# Patient Record
Sex: Male | Born: 1980 | Race: White | Hispanic: No | Marital: Married | Smoking: Current every day smoker
Health system: Southern US, Community
[De-identification: ages and names within clinical notes are randomized; demographics above are authoritative.]

## PROBLEM LIST (undated history)

## (undated) DIAGNOSIS — F1110 Opioid abuse, uncomplicated: Secondary | ICD-10-CM

## (undated) HISTORY — PX: TYMPANOSTOMY TUBE PLACEMENT: SHX32

---

## 2004-08-06 ENCOUNTER — Ambulatory Visit (HOSPITAL_COMMUNITY): Admission: RE | Admit: 2004-08-06 | Discharge: 2004-08-06 | Payer: Self-pay | Admitting: Family Medicine

## 2004-08-26 ENCOUNTER — Ambulatory Visit (HOSPITAL_COMMUNITY): Admission: RE | Admit: 2004-08-26 | Discharge: 2004-08-26 | Payer: Self-pay | Admitting: Orthopaedic Surgery

## 2014-11-28 ENCOUNTER — Emergency Department (HOSPITAL_COMMUNITY)
Admission: EM | Admit: 2014-11-28 | Discharge: 2014-11-28 | Disposition: A | Discharge status: Home / Self Care | Payer: BLUE CROSS/BLUE SHIELD | Attending: Emergency Medicine | Admitting: Emergency Medicine

## 2014-11-28 ENCOUNTER — Emergency Department (HOSPITAL_COMMUNITY): Payer: BLUE CROSS/BLUE SHIELD

## 2014-11-28 ENCOUNTER — Encounter (HOSPITAL_COMMUNITY): Payer: Self-pay | Admitting: Emergency Medicine

## 2014-11-28 DIAGNOSIS — M546 Pain in thoracic spine: Secondary | ICD-10-CM | POA: Diagnosis not present

## 2014-11-28 DIAGNOSIS — M545 Low back pain, unspecified: Secondary | ICD-10-CM

## 2014-11-28 DIAGNOSIS — R51 Headache: Secondary | ICD-10-CM | POA: Diagnosis not present

## 2014-11-28 DIAGNOSIS — Z79899 Other long term (current) drug therapy: Secondary | ICD-10-CM | POA: Insufficient documentation

## 2014-11-28 DIAGNOSIS — M549 Dorsalgia, unspecified: Secondary | ICD-10-CM

## 2014-11-28 DIAGNOSIS — R112 Nausea with vomiting, unspecified: Secondary | ICD-10-CM | POA: Diagnosis not present

## 2014-11-28 DIAGNOSIS — R1013 Epigastric pain: Secondary | ICD-10-CM | POA: Diagnosis not present

## 2014-11-28 DIAGNOSIS — Z720 Tobacco use: Secondary | ICD-10-CM | POA: Insufficient documentation

## 2014-11-28 DIAGNOSIS — R109 Unspecified abdominal pain: Secondary | ICD-10-CM | POA: Diagnosis not present

## 2014-11-28 DIAGNOSIS — R0602 Shortness of breath: Secondary | ICD-10-CM | POA: Insufficient documentation

## 2014-11-28 DIAGNOSIS — R1010 Upper abdominal pain, unspecified: Secondary | ICD-10-CM | POA: Diagnosis not present

## 2014-11-28 HISTORY — DX: Opioid abuse, uncomplicated: F11.10

## 2014-11-28 LAB — COMPREHENSIVE METABOLIC PANEL
ALK PHOS: 75 U/L (ref 38–126)
ALT: 35 U/L (ref 17–63)
ANION GAP: 10 (ref 5–15)
AST: 28 U/L (ref 15–41)
Albumin: 4.7 g/dL (ref 3.5–5.0)
BUN: 20 mg/dL (ref 6–20)
CALCIUM: 9.4 mg/dL (ref 8.9–10.3)
CO2: 26 mmol/L (ref 22–32)
Chloride: 104 mmol/L (ref 101–111)
Creatinine, Ser: 1.01 mg/dL (ref 0.61–1.24)
GLUCOSE: 101 mg/dL — AB (ref 70–99)
Potassium: 4.2 mmol/L (ref 3.5–5.1)
Sodium: 140 mmol/L (ref 135–145)
TOTAL PROTEIN: 7.5 g/dL (ref 6.5–8.1)
Total Bilirubin: 0.5 mg/dL (ref 0.3–1.2)

## 2014-11-28 LAB — CBC WITH DIFFERENTIAL/PLATELET
BASOS ABS: 0 10*3/uL (ref 0.0–0.1)
BASOS PCT: 0 % (ref 0–1)
EOS PCT: 2 % (ref 0–5)
Eosinophils Absolute: 0.4 10*3/uL (ref 0.0–0.7)
HCT: 42.6 % (ref 39.0–52.0)
HEMOGLOBIN: 14.2 g/dL (ref 13.0–17.0)
Lymphocytes Relative: 24 % (ref 12–46)
Lymphs Abs: 4.3 10*3/uL — ABNORMAL HIGH (ref 0.7–4.0)
MCH: 28.3 pg (ref 26.0–34.0)
MCHC: 33.3 g/dL (ref 30.0–36.0)
MCV: 85 fL (ref 78.0–100.0)
MONO ABS: 1.2 10*3/uL — AB (ref 0.1–1.0)
Monocytes Relative: 7 % (ref 3–12)
Neutro Abs: 11.6 10*3/uL — ABNORMAL HIGH (ref 1.7–7.7)
Neutrophils Relative %: 67 % (ref 43–77)
Platelets: 230 10*3/uL (ref 150–400)
RBC: 5.01 MIL/uL (ref 4.22–5.81)
RDW: 13.6 % (ref 11.5–15.5)
WBC: 17.5 10*3/uL — AB (ref 4.0–10.5)

## 2014-11-28 LAB — LIPASE, BLOOD: Lipase: 33 U/L (ref 22–51)

## 2014-11-28 MED ORDER — HYDROMORPHONE HCL 1 MG/ML IJ SOLN
1.0000 mg | Freq: Once | INTRAMUSCULAR | Status: AC
  Administered 2014-11-28: 1 mg via INTRAVENOUS
  Filled 2014-11-28: qty 1

## 2014-11-28 MED ORDER — IOHEXOL 300 MG/ML  SOLN
50.0000 mL | Freq: Once | INTRAMUSCULAR | Status: AC | PRN
  Administered 2014-11-28: 50 mL via ORAL

## 2014-11-28 MED ORDER — CYCLOBENZAPRINE HCL 10 MG PO TABS: 10.0000 mg | ORAL_TABLET | Freq: Three times a day (TID) | ORAL | Status: DC | PRN

## 2014-11-28 MED ORDER — ONDANSETRON HCL 4 MG/2ML IJ SOLN
4.0000 mg | Freq: Once | INTRAMUSCULAR | Status: AC
  Administered 2014-11-28: 4 mg via INTRAVENOUS
  Filled 2014-11-28: qty 2

## 2014-11-28 MED ORDER — NAPROXEN 500 MG PO TABS: 500.0000 mg | ORAL_TABLET | Freq: Two times a day (BID) | ORAL | Status: DC

## 2014-11-28 MED ORDER — HYDROMORPHONE HCL 2 MG/ML IJ SOLN
2.0000 mg | Freq: Once | INTRAMUSCULAR | Status: AC
  Administered 2014-11-28: 2 mg via INTRAMUSCULAR
  Filled 2014-11-28: qty 1

## 2014-11-28 MED ORDER — HYDROMORPHONE HCL 1 MG/ML IJ SOLN
1.0000 mg | Freq: Once | INTRAMUSCULAR | Status: AC
  Administered 2014-11-28: 1 mg via INTRAVENOUS

## 2014-11-28 MED ORDER — HYDROMORPHONE HCL 1 MG/ML IJ SOLN
INTRAMUSCULAR | Status: AC
  Filled 2014-11-28: qty 1

## 2014-11-28 MED ORDER — OMEPRAZOLE 20 MG PO CPDR: 20.0000 mg | DELAYED_RELEASE_CAPSULE | Freq: Two times a day (BID) | ORAL | Status: DC

## 2014-11-28 MED ORDER — FAMOTIDINE 20 MG PO TABS: 20.0000 mg | ORAL_TABLET | Freq: Two times a day (BID) | ORAL | Status: DC

## 2014-11-28 MED ORDER — IOHEXOL 300 MG/ML  SOLN
100.0000 mL | Freq: Once | INTRAMUSCULAR | Status: AC | PRN
  Administered 2014-11-28: 100 mL via INTRAVENOUS

## 2014-11-28 NOTE — ED Provider Notes (Signed)
CSN: [id number]     Arrival date & time 11/28/14  1807 History  This chart was scribed for Vanetta Mulders, MD by Modena Jansky, ED Scribe. This patient was seen in room APA05/[facility]05 and the patient's care was started at 10:25 PM.   Chief Complaint  Patient presents with  . Back Pain   Patient is a 34 y.o. male presenting with back pain. The history is provided by the patient. No language interpreter was used.  Back Pain Location:  Lumbar spine Quality:  Stabbing Pain severity:  Moderate Onset quality:  Sudden Duration:  6 hours Timing:  Constant Progression:  Unchanged Chronicity:  Recurrent Relieved by:  Nothing Worsened by:  Nothing tried Ineffective treatments:  Ibuprofen Associated symptoms: abdominal pain, dysuria and headaches   Associated symptoms: no chest pain, no fever, no numbness and no weakness    HPI Comments: Brad Ramirez is a 34 y.o. male who presents to the Emergency Department complaining of constant moderate upper lumbar back pain that started about 6 hours ago. He states that he was seen in the ED last night for back pain. He reports that he had relief from his pain since then until about 6 hours ago when he had a sudden onset. He describes the pain as a stabbing sensation and currently rates the severity as a 7.  He reports that movement exacerbates the pain. He states that he took ibuprofen PTA  without any relief. He denies any fever.   PCP- Phillips Odor  Past Medical History  Diagnosis Date  . Opioid abuse    History reviewed. No pertinent past surgical history. History reviewed. No pertinent family history. History  Substance Use Topics  . Smoking status: Current Every Day Smoker    Types: Cigarettes  . Smokeless tobacco: Not on file  . Alcohol Use: No    Review of Systems  Constitutional: Negative for fever.  HENT: Negative for congestion and rhinorrhea.   Eyes: Negative for visual disturbance.  Respiratory: Positive for shortness of breath.  Negative for cough.   Cardiovascular: Negative for chest pain and leg swelling.  Gastrointestinal: Positive for nausea, vomiting and abdominal pain. Negative for diarrhea.  Genitourinary: Positive for dysuria.  Musculoskeletal: Positive for back pain. Negative for joint swelling and neck pain.  Skin: Negative for rash.  Neurological: Positive for headaches. Negative for weakness and numbness.  Hematological: Does not bruise/bleed easily.  Psychiatric/Behavioral: Negative for confusion.    Allergies  Review of patient's allergies indicates no known allergies.  Home Medications   Prior to Admission medications   Medication Sig Start Date End Date Taking? Authorizing Provider  cyclobenzaprine (FLEXERIL) 10 MG tablet Take 1 tablet (10 mg total) by mouth 3 (three) times daily as needed for muscle spasms. 11/28/14  Yes Azalia Bilis, MD  QUEtiapine (SEROQUEL) 50 MG tablet Take 50 mg by mouth at bedtime. 11/24/14  Yes Historical Provider, MD  famotidine (PEPCID) 20 MG tablet Take 1 tablet (20 mg total) by mouth 2 (two) times daily. 11/28/14   Vanetta Mulders, MD  naproxen (NAPROSYN) 500 MG tablet Take 1 tablet (500 mg total) by mouth 2 (two) times daily. 11/28/14   Vanetta Mulders, MD  omeprazole (PRILOSEC) 20 MG capsule Take 1 capsule (20 mg total) by mouth 2 (two) times daily before a meal. 11/28/14   Azalia Bilis, MD   BP 108/87 mmHg  Pulse 85  Temp(Src) 97.9 F (36.6 C) (Oral)  Resp 20  Ht  (1.88 m)  Wt 230 lb (  104.327 kg)  BMI 29.52 kg/m2  SpO2 100% Physical Exam  Constitutional: He is oriented to person, place, and time. He appears well-developed and well-nourished. No distress.  HENT:  Head: Normocephalic and atraumatic.  Moist mucous membranes.   Eyes: EOM are normal. Pupils are equal, round, and reactive to light.  Neck: Neck supple. No tracheal deviation present.  Cardiovascular: Normal rate and regular rhythm.   Pulmonary/Chest: Effort normal. No respiratory distress. He  has no wheezes. He has no rales.  Abdominal: Soft. Bowel sounds are normal. There is no tenderness.  Musculoskeletal: Normal range of motion.  No ankle swelling.  Neurological: He is alert and oriented to person, place, and time. No cranial nerve deficit.  Skin: Skin is warm and dry.  Psychiatric: He has a normal mood and affect. His behavior is normal.  Nursing note and vitals reviewed.   ED Course  Procedures (including critical care time) DIAGNOSTIC STUDIES: Oxygen Saturation is 100% on RA, normal by my interpretation.    COORDINATION OF CARE: 10:29 PM- Pt advised of plan for treatment and pt agrees.  Labs Review Labs Reviewed - No data to display  Imaging Review Ct Abdomen Pelvis W Contrast  11/28/2014   CLINICAL DATA:  34 year old male with upper abdominal pain radiating to the back since early this morning. Leukocytosis. Initial encounter.  EXAM: CT ABDOMEN AND PELVIS WITH CONTRAST  TECHNIQUE: Multidetector CT imaging of the abdomen and pelvis was performed using the standard protocol following bolus administration of intravenous contrast.  CONTRAST:  100mL OMNIPAQUE IOHEXOL 300 MG/ML  SOLN  COMPARISON:  Lumbar MRI 08/26/2004.  FINDINGS: Negative lung bases.  No pericardial or pleural effusion.  Normal lumbar segmentation. Widespread mild endplate irregularity in the visible lower thoracic spine. Evidence of chronic L5-S1 disc degeneration again noted. Mild lower lumbar facet hypertrophy. No spinal stenosis. No acute osseous abnormality identified.  No pelvic free fluid.  Negative distal colon.  Negative left colon and transverse colon aside from retained stool and some redundancy. Redundant hepatic flexure. Retained stool in the right colon. Normal retrocecal appendix. Normal terminal ileum. No dilated small bowel. Oral contrast is present from the stomach to the proximal jejunum. Negative stomach and duodenum.  Liver, gallbladder, spleen, pancreas and adrenal glands are within normal  limits. Suboptimal intravascular contrast bolus but the portal venous system and major arterial structures appear patent. No abdominal free fluid. No lymphadenopathy. Both kidneys and ureters appear normal. Unremarkable bladder.  IMPRESSION: 1. Negative CT Abdomen and Pelvis. 2. Multilevel chronic thoracic spine endplate irregularity may indicate Scheuermann's Disease. Chronic L5-S1 disc and endplate degeneration.   Electronically Signed   By: Odessa FlemingH  Hall M.D.   On: 11/28/2014 07:08   Dg Chest Portable 1 View  11/28/2014   CLINICAL DATA:  Mid upper back pain since 2 a.m.  EXAM: PORTABLE CHEST - 1 VIEW  COMPARISON:  08/06/2004  FINDINGS: Normal heart size and pulmonary vascularity. No focal airspace disease or consolidation in the lungs. No blunting of costophrenic angles. No pneumothorax. Mediastinal contours appear intact. No free air is demonstrated under the hemidiaphragms on chest radiograph. Abdominal radiographs were not obtained.  IMPRESSION: No active disease.   Electronically Signed   By: Burman NievesWilliam  Stevens M.D.   On: 11/28/2014 05:48     EKG Interpretation None      MDM   Final diagnoses:  Bilateral low back pain without sciatica    Patient CT of abdomen and pelvis from last evening showed multiple degenerative changes in the  lumbar part of the back which may explain his symptoms. Some concern for the elevated white blood cell count as well. [facility] these. Patient clinically here looks nontoxic no acute distress. No fevers. Abdomen soft nontender. Suspect that this is muscle skeletal back problems. Review of an MRI from 2006 did show multiple gender changes then. Also patient is actually had this for a while. Patient stable for discharge home. Patient given intramuscular injection of hydromorphone and will be discharged home on Naprosyn and Pepcid.     I personally performed the services described in this documentation, which was scribed in my  presence. The recorded information has been reviewed and is accurate.      Vanetta MuldersScott Yasser Hepp, MD 11/28/14 2322

## 2014-11-28 NOTE — ED Provider Notes (Signed)
CSN: [id number]     Arrival date & time 11/28/14  [id number] History   First MD Initiated Contact with Patient 11/28/14 0520     Chief Complaint  Patient presents with  . Back Pain      HPI Patient reports developing severe mid back pain since early this morning.  There is radiation through to his upper abdomen.  No history of ulcers before in the past.  He does have a history of opioid abuse but denies heavy NSAID use.  He currently is been clean from opioids for several months.  He is here with the spouse.  Reports nausea without vomiting.  She denies diarrhea.  No fevers or chills.  Denies recent injury or trauma.  No shortness of breath at this time.  He points to his mid thoracic region when describing the mid back pain.  Pain is moderate to severe in severity at this time.  Nothing worsens or improves his symptoms.   Past Medical History  Diagnosis Date  . Opioid abuse    History reviewed. No pertinent past surgical history. History reviewed. No pertinent family history. History  Substance Use Topics  . Smoking status: Current Every Day Smoker    Types: Cigarettes  . Smokeless tobacco: Not on file  . Alcohol Use: No  Prior opioid addiction now in remission  Review of Systems  All other systems reviewed and are negative.     Allergies  Review of patient's allergies indicates not on file.  Home Medications   Prior to Admission medications   Not on File   BP 145/93 mmHg  Pulse 70  Temp(Src) 97.7 F (36.5 C) (Oral)  Resp 20  Ht 6\' 2"  (1.88 m)  Wt 108 lb (48.988 kg)  BMI 13.86 kg/m2  SpO2 100% Physical Exam  Constitutional: He is oriented to person, place, and time. He appears well-developed and well-nourished.  HENT:  Head: Normocephalic and atraumatic.  Eyes: EOM are normal.  Neck: Normal range of motion.  Cardiovascular: Normal rate, regular rhythm, normal heart sounds and intact distal pulses.   Pulmonary/Chest: Effort normal and breath sounds normal. No  respiratory distress.  Abdominal: Soft. He exhibits no distension.  Mild epigastric tenderness without guarding or rebound.  Musculoskeletal: Normal range of motion.  Neurological: He is alert and oriented to person, place, and time.  Skin: Skin is warm and dry.  Psychiatric: He has a normal mood and affect. Judgment normal.  Nursing note and vitals reviewed.   ED Course  Procedures (including critical care time) Labs Review Labs Reviewed  CBC WITH DIFFERENTIAL/PLATELET - Abnormal; Notable for the following:    WBC 17.5 (*)    Neutro Abs 11.6 (*)    Lymphs Abs 4.3 (*)    Monocytes Absolute 1.2 (*)    All other components within normal limits  COMPREHENSIVE METABOLIC PANEL - Abnormal; Notable for the following:    Glucose, Bld 101 (*)    All other components within normal limits  LIPASE, BLOOD    Imaging Review Ct Abdomen Pelvis W Contrast  11/28/2014   CLINICAL DATA:  34 year old male with upper abdominal pain radiating to the back since early this morning. Leukocytosis. Initial encounter.  EXAM: CT ABDOMEN AND PELVIS WITH CONTRAST  TECHNIQUE: Multidetector CT imaging of the abdomen and pelvis was performed using the standard protocol following bolus administration of intravenous contrast.  CONTRAST:  100mL OMNIPAQUE IOHEXOL 300 MG/ML  SOLN  COMPARISON:  Lumbar MRI 08/26/2004.  FINDINGS: Negative lung bases.  No pericardial or pleural effusion.  Normal lumbar segmentation. Widespread mild endplate irregularity in the visible lower thoracic spine. Evidence of chronic L5-S1 disc degeneration again noted. Mild lower lumbar facet hypertrophy. No spinal stenosis. No acute osseous abnormality identified.  No pelvic free fluid.  Negative distal colon.  Negative left colon and transverse colon aside from retained stool and some redundancy. Redundant hepatic flexure. Retained stool in the right colon. Normal retrocecal appendix. Normal terminal ileum. No dilated small bowel. Oral contrast is  present from the stomach to the proximal jejunum. Negative stomach and duodenum.  Liver, gallbladder, spleen, pancreas and adrenal glands are within normal limits. Suboptimal intravascular contrast bolus but the portal venous system and major arterial structures appear patent. No abdominal free fluid. No lymphadenopathy. Both kidneys and ureters appear normal. Unremarkable bladder.  IMPRESSION: 1. Negative CT Abdomen and Pelvis. 2. Multilevel chronic thoracic spine endplate irregularity may indicate Scheuermann's Disease. Chronic L5-S1 disc and endplate degeneration.   Electronically Signed   By: Odessa FlemingH  Hall M.D.   On: 11/28/2014 07:08   Dg Chest Portable 1 View  11/28/2014   CLINICAL DATA:  Mid upper back pain since 2 a.m.  EXAM: PORTABLE CHEST - 1 VIEW  COMPARISON:  08/06/2004  FINDINGS: Normal heart size and pulmonary vascularity. No focal airspace disease or consolidation in the lungs. No blunting of costophrenic angles. No pneumothorax. Mediastinal contours appear intact. No free air is demonstrated under the hemidiaphragms on chest radiograph. Abdominal radiographs were not obtained.  IMPRESSION: No active disease.   Electronically Signed   By: Burman NievesWilliam  Stevens M.D.   On: 11/28/2014 05:48  I personally reviewed the imaging tests through PACS system I reviewed available ER/hospitalization records through the EMR    EKG Interpretation None      MDM   Final diagnoses:  Mid back pain  Upper abdominal pain    Initial plain film negative for acute free air based on upright chest x-ray.  Patient will undergo CT abdomen and pelvis to better define upper abdominal pain and back pain.  Low suspicion for aortic dissection.  High suspicion for duodenal ulcer with possible microperforation  7:45 AM Patient feels much better at this time.  He does report that he wore heavy backpack yesterday.  Some of this could be musculoskeletal in nature.  Will send home with prescription for muscle relaxants and  Prilosec.  Outpatient GI follow-up.  Primary care follow-up as well.  Patient understands to return to the ER for new or worsening symptoms.  Vital signs are normal.  Azalia BilisKevin Caysie Minnifield, MD 11/28/14 [phone]

## 2014-11-28 NOTE — Discharge Instructions (Signed)
Follow[facility]. Take medications as directed. Work note provided. Return for any new or worse symptoms. return for development of fevers or persistent vomiting or any worse abdominal pain.

## 2014-11-28 NOTE — ED Notes (Signed)
Pt c/o mid back pain since early this am.

## 2014-11-28 NOTE — ED Notes (Signed)
Pt still c/o mid back pain

## 2014-11-28 NOTE — ED Notes (Signed)
Pt states that he was evaluated this morning early for back pain and pain returned about 1630 this afternoon.

## 2016-05-25 ENCOUNTER — Inpatient Hospital Stay (HOSPITAL_COMMUNITY)
Admission: EM | Admit: 2016-05-25 | Discharge: 2016-05-29 | DRG: 419 | Disposition: A | Discharge status: Home / Self Care | Payer: BLUE CROSS/BLUE SHIELD | Attending: Internal Medicine | Admitting: Internal Medicine

## 2016-05-25 ENCOUNTER — Encounter (HOSPITAL_COMMUNITY): Payer: Self-pay | Admitting: Emergency Medicine

## 2016-05-25 ENCOUNTER — Emergency Department (HOSPITAL_COMMUNITY): Payer: BLUE CROSS/BLUE SHIELD

## 2016-05-25 DIAGNOSIS — K831 Obstruction of bile duct: Secondary | ICD-10-CM | POA: Diagnosis present

## 2016-05-25 DIAGNOSIS — K8067 Calculus of gallbladder and bile duct with acute and chronic cholecystitis with obstruction: Principal | ICD-10-CM | POA: Diagnosis present

## 2016-05-25 DIAGNOSIS — Z791 Long term (current) use of non-steroidal anti-inflammatories (NSAID): Secondary | ICD-10-CM

## 2016-05-25 DIAGNOSIS — R112 Nausea with vomiting, unspecified: Secondary | ICD-10-CM | POA: Diagnosis present

## 2016-05-25 DIAGNOSIS — Z79899 Other long term (current) drug therapy: Secondary | ICD-10-CM

## 2016-05-25 DIAGNOSIS — R7989 Other specified abnormal findings of blood chemistry: Secondary | ICD-10-CM | POA: Diagnosis present

## 2016-05-25 DIAGNOSIS — R945 Abnormal results of liver function studies: Secondary | ICD-10-CM

## 2016-05-25 DIAGNOSIS — F1111 Opioid abuse, in remission: Secondary | ICD-10-CM | POA: Diagnosis present

## 2016-05-25 DIAGNOSIS — D72829 Elevated white blood cell count, unspecified: Secondary | ICD-10-CM | POA: Diagnosis present

## 2016-05-25 DIAGNOSIS — Z23 Encounter for immunization: Secondary | ICD-10-CM

## 2016-05-25 DIAGNOSIS — F17210 Nicotine dependence, cigarettes, uncomplicated: Secondary | ICD-10-CM | POA: Diagnosis present

## 2016-05-25 DIAGNOSIS — K219 Gastro-esophageal reflux disease without esophagitis: Secondary | ICD-10-CM | POA: Diagnosis present

## 2016-05-25 DIAGNOSIS — K8050 Calculus of bile duct without cholangitis or cholecystitis without obstruction: Secondary | ICD-10-CM

## 2016-05-25 DIAGNOSIS — K839 Disease of biliary tract, unspecified: Secondary | ICD-10-CM

## 2016-05-25 LAB — CBC WITH DIFFERENTIAL/PLATELET
BASOS PCT: 0 %
Basophils Absolute: 0 10*3/uL (ref 0.0–0.1)
EOS ABS: 0.2 10*3/uL (ref 0.0–0.7)
Eosinophils Relative: 2 %
HCT: 41.9 % (ref 39.0–52.0)
Hemoglobin: 14.2 g/dL (ref 13.0–17.0)
Lymphocytes Relative: 35 %
Lymphs Abs: 4.1 10*3/uL — ABNORMAL HIGH (ref 0.7–4.0)
MCH: 29.2 pg (ref 26.0–34.0)
MCHC: 33.9 g/dL (ref 30.0–36.0)
MCV: 86 fL (ref 78.0–100.0)
MONO ABS: 0.9 10*3/uL (ref 0.1–1.0)
MONOS PCT: 8 %
Neutro Abs: 6.4 10*3/uL (ref 1.7–7.7)
Neutrophils Relative %: 55 %
PLATELETS: 310 10*3/uL (ref 150–400)
RBC: 4.87 MIL/uL (ref 4.22–5.81)
RDW: 15.4 % (ref 11.5–15.5)
WBC: 11.7 10*3/uL — ABNORMAL HIGH (ref 4.0–10.5)

## 2016-05-25 LAB — COMPREHENSIVE METABOLIC PANEL
ALBUMIN: 4.1 g/dL (ref 3.5–5.0)
ALT: 499 U/L — ABNORMAL HIGH (ref 17–63)
ANION GAP: 8 (ref 5–15)
AST: 235 U/L — ABNORMAL HIGH (ref 15–41)
Alkaline Phosphatase: 250 U/L — ABNORMAL HIGH (ref 38–126)
BUN: 15 mg/dL (ref 6–20)
CHLORIDE: 104 mmol/L (ref 101–111)
CO2: 25 mmol/L (ref 22–32)
Calcium: 9.4 mg/dL (ref 8.9–10.3)
Creatinine, Ser: 0.81 mg/dL (ref 0.61–1.24)
GFR calc Af Amer: >60 mL/min (ref >60)
GFR calc non Af Amer: >60 mL/min (ref >60)
GLUCOSE: 102 mg/dL — AB (ref 65–99)
POTASSIUM: 3.6 mmol/L (ref 3.5–5.1)
SODIUM: 137 mmol/L (ref 135–145)
TOTAL PROTEIN: 7.5 g/dL (ref 6.5–8.1)
Total Bilirubin: 8.3 mg/dL — ABNORMAL HIGH (ref 0.3–1.2)

## 2016-05-25 LAB — URINALYSIS, ROUTINE W REFLEX MICROSCOPIC
GLUCOSE, UA: NEGATIVE mg/dL
Hgb urine dipstick: NEGATIVE
KETONES UR: NEGATIVE mg/dL
Leukocytes, UA: NEGATIVE
NITRITE: NEGATIVE
PH: 7 (ref 5.0–8.0)
PROTEIN: NEGATIVE mg/dL
Specific Gravity, Urine: 1.01 (ref 1.005–1.030)

## 2016-05-25 LAB — LIPASE, BLOOD: Lipase: 40 U/L (ref 11–51)

## 2016-05-25 MED ORDER — IOPAMIDOL (ISOVUE-300) INJECTION 61%
INTRAVENOUS | Status: AC
  Administered 2016-05-25: 23:00:00
  Filled 2016-05-25: qty 30

## 2016-05-25 MED ORDER — SODIUM CHLORIDE 0.9 % IV BOLUS (SEPSIS)
1000.0000 mL | Freq: Once | INTRAVENOUS | Status: AC
  Administered 2016-05-25: 1000 mL via INTRAVENOUS

## 2016-05-25 MED ORDER — HYDROMORPHONE HCL 1 MG/ML IJ SOLN
1.0000 mg | Freq: Once | INTRAMUSCULAR | Status: AC
Start: 2016-05-25 — End: 2016-05-25
  Administered 2016-05-25: 1 mg via INTRAVENOUS
  Filled 2016-05-25: qty 1

## 2016-05-25 MED ORDER — ONDANSETRON HCL 4 MG/2ML IJ SOLN
4.0000 mg | Freq: Once | INTRAMUSCULAR | Status: AC
  Administered 2016-05-25: 4 mg via INTRAVENOUS
  Filled 2016-05-25: qty 2

## 2016-05-25 NOTE — ED Triage Notes (Signed)
Per pt he has been having right sided flank and RUQ pain that have been going on for 2 weeks.  Pt reports decrease in urine, very dark colored urine, pain with urination.

## 2016-05-25 NOTE — ED Provider Notes (Signed)
[facility]-EMERGENCY DEPT Provider Note   CSN: [id number] Arrival date & time: 05/25/16  2019  By signing my name below, I, Christy SartoriusAnastasia Kolousek, attest that this documentation has been prepared under the direction and in the presence of Gilda Creasehristopher J Pollina, MD . Electronically Signed: Christy SartoriusAnastasia Kolousek, Scribe. 05/25/2016. 12:01 AM  History   Chief Complaint Chief Complaint  Patient presents with  . Flank Pain   The history is provided by the patient and medical records. No language interpreter was used.     HPI Comments:  Brad Ramirez is a 35 y.o. male who presents to the Emergency Department complaining of intermittent epigastric and RUQ pain for the last couple weeks.  He notes associated vomiting, diarrhea and dark urine.  His wife adds that the whites of his eyes have seemed yellow.   His pain and vomiting is worse with eating and drinking. Pt denies history of surgeries, recent travel, and strange food.     Past Medical History:  Diagnosis Date  . Opioid abuse     There are no active problems to display for this patient.   History reviewed. No pertinent surgical history.     Home Medications    Prior to Admission medications   Medication Sig Start Date End Date Taking? Authorizing Provider  cyclobenzaprine (FLEXERIL) 10 MG tablet Take 1 tablet (10 mg total) by mouth 3 (three) times daily as needed for muscle spasms. 11/28/14   Azalia BilisKevin Campos, MD  famotidine (PEPCID) 20 MG tablet Take 1 tablet (20 mg total) by mouth 2 (two) times daily. 11/28/14   Vanetta MuldersScott Zackowski, MD  naproxen (NAPROSYN) 500 MG tablet Take 1 tablet (500 mg total) by mouth 2 (two) times daily. 11/28/14   Vanetta MuldersScott Zackowski, MD  omeprazole (PRILOSEC) 20 MG capsule Take 1 capsule (20 mg total) by mouth 2 (two) times daily before a meal. 11/28/14   Azalia BilisKevin Campos, MD  QUEtiapine (SEROQUEL) 50 MG tablet Take 50 mg by mouth at bedtime. 11/24/14   Historical Provider, MD    Family History History reviewed. No  pertinent family history.  Social History Social History  Substance Use Topics  . Smoking status: Current Every Day Smoker    Packs/day: 0.50    Types: Cigarettes  . Smokeless tobacco: Never Used  . Alcohol use Yes     Comment: occassionally     Allergies   Patient has no known allergies.   Review of Systems Review of Systems  Eyes:       Scleral icterus.    Gastrointestinal: Positive for abdominal pain, diarrhea and vomiting.  All other systems reviewed and are negative.    Physical Exam Updated Vital Signs BP 102/62 (BP Location: Left Arm)   Pulse (!) 57   Temp 98.8 F (37.1 C) (Oral)   Resp 18   Ht 6\' 2"  (1.88 m)   Wt 205 lb (93 kg)   SpO2 98%   BMI 26.32 kg/m   Physical Exam  Constitutional: He is oriented to person, place, and time. He appears well-developed and well-nourished. No distress.  HENT:  Head: Normocephalic and atraumatic.  Eyes: Conjunctivae are normal. Scleral icterus is present.  Cardiovascular: Normal rate.   Pulmonary/Chest: Effort normal.  Abdominal:  RUQ tenderness.    Neurological: He is alert and oriented to person, place, and time.  Skin: Skin is warm and dry.  Psychiatric: He has a normal mood and affect.  Nursing note and vitals reviewed.    ED Treatments / Results  DIAGNOSTIC STUDIES:  Oxygen Saturation is 100% on RA, NML by my interpretation.    COORDINATION OF CARE:  11:10 PM Discussed treatment plan with pt at bedside and pt agreed to plan.   RUQ tenderness and some sclerl ict  Labs (all labs ordered are listed, but only abnormal results are displayed) Labs Reviewed  URINALYSIS, ROUTINE W REFLEX MICROSCOPIC (NOT AT [facility]) - Abnormal; Notable for the following:       Result Value   Bilirubin Urine LARGE (*)    All other components within normal limits  CBC WITH DIFFERENTIAL/PLATELET - Abnormal; Notable for the following:    WBC 11.7 (*)    Lymphs Abs 4.1 (*)    All other components within normal limits    COMPREHENSIVE METABOLIC PANEL - Abnormal; Notable for the following:    Glucose, Bld 102 (*)    AST 235 (*)    ALT 499 (*)    Alkaline Phosphatase 250 (*)    Total Bilirubin 8.3 (*)    All other components within normal limits  LIPASE, BLOOD    EKG  EKG Interpretation None       Radiology Ct Abdomen Pelvis W Contrast  Result Date: 05/26/2016 CLINICAL DATA:  Right flank pain, right upper quadrant pain with nausea and vomiting for 2 weeks. Dark colored urine and difficulty urinating. EXAM: CT ABDOMEN AND PELVIS WITH CONTRAST TECHNIQUE: Multidetector CT imaging of the abdomen and pelvis was performed using the standard protocol following bolus administration of intravenous contrast. CONTRAST:  100mL ISOVUE-300 IOPAMIDOL (ISOVUE-300) INJECTION 61% COMPARISON:  11/28/2014 CT. FINDINGS: Lower chest: Respiratory motion limits assessment. No pneumonic consolidation, effusion or pneumothorax. Normal sized cardiac chambers to the extent visible. No pericardial effusion. Hepatobiliary: There is intra and extrahepatic biliary dilatation which is a new finding since prior exam. The common bile duct is distended measuring up to 11 mm in caliber distal and projects into the lumen of the second portion of the duodenum. Differential possibilities may include a stricture at the ampulla or potentially a choledochal cyst variant. No definite obstructing calculus. The gallbladder is not distended. Pancreas: No pancreatic ductal dilatation or definite mass. Spleen: Small splenule is seen near the splenic hilum. There is no splenomegaly. Adrenals/Urinary Tract: Normal adrenal glands. No obstructive uropathy of the kidneys. No enhancing renal mass. No nephrolithiasis. Urinary bladder is distended without calculus nor wall thickening. Stomach/Bowel: No bowel obstruction or acute inflammation. Normal-appearing appendix. Normal bowel rotation. Vascular/Lymphatic: No significant vascular findings are present. No enlarged  abdominal or pelvic lymph nodes. Reproductive: Prostate is unremarkable. Other: No abdominal wall hernia or abnormality. No abdominopelvic ascites. Musculoskeletal: No acute or significant osseous findings. IMPRESSION: Dilated intra and extrahepatic bile ducts with slight protrusion of the distal common bile duct into the second portion of the duodenum. Differential possibilities may include stenosis at the ampulla or possibly a choledochal cyst variant though this finding was not present in 2016 (Ser. 4, Image 36). No choledocholithiasis is apparent. No focal mass is identified. Electronically Signed   By: Tollie Ethavid  Kwon M.D.   On: 05/26/2016 01:18    Procedures Procedures (including critical care time)  Medications Ordered in ED Medications  sodium chloride 0.9 % bolus 1,000 mL (0 mLs Intravenous Stopped 05/26/16 0055)  ondansetron (ZOFRAN) injection 4 mg (4 mg Intravenous Given 05/25/16 2321)  HYDROmorphone (DILAUDID) injection 1 mg (1 mg Intravenous Given 05/25/16 2321)  iopamidol (ISOVUE-300) 61 % injection (  Contrast Given 05/25/16 2322)  iopamidol (ISOVUE-300) 61 % injection 100  mL (100 mLs Intravenous Contrast Given 05/26/16 0033)  HYDROmorphone (DILAUDID) injection 1 mg (1 mg Intravenous Given 05/26/16 0057)     Initial Impression / Assessment and Plan / ED Course  I have reviewed the triage vital signs and the nursing notes.  Pertinent labs & imaging results that were available during my care of the patient were reviewed by me and considered in my medical decision making (see chart for details).  Clinical Course    Patient presents to the ER for evaluation of a 2 week history of progressively worsening upper abdominal pain. Patient reports that symptoms worsen when he eats. He has noticed that his urine has been dark. Patient having difficulty eating because of nausea and vomiting when he eats.  She did reveal evidence of scleral icterus, mild jaundice. Patient had right upper quadrant  tenderness. Lab work reveals moderately elevated AST, ALT, alkaline phosphatase with total bilirubin of 8.3.  CT scan abdomen and pelvis performed. Patient has evidence of intraductal an extra ductal dilatation with possible stricture versus cyst of the common bile duct.  Patient's presentation, lab work and CT scan were discussed with Dr. Darrick Penna, on-call for gastroenterology. She did not feel that the patient could be cared for here at [facility]. She also felt that the patient would require tertiary care, not [facility]. She recommended transfer to [facility]. This was discussed with the patient and he is in agreement with the plan.  I did discuss the patient with Dr. Laural Benes, on-call for medical admissions at [facility]. I was informed that there were no beds available. I therefore discussed the case with Dr. Evette Cristal, for gastroenterology at [facility]. He did feel that the patient could be cared for at [facility], recommended transfer to the hospitalist service, patient will be seen in the morning by GI.  Final Clinical Impressions(s) / ED Diagnoses   Final diagnoses:  Biliary obstruction    New Prescriptions New Prescriptions   No medications on file   I personally performed the services described in this documentation, which was scribed in my presence. The recorded information has been reviewed and is accurate.    Gilda Crease, MD 05/26/16 [phone]

## 2016-05-26 ENCOUNTER — Inpatient Hospital Stay (HOSPITAL_COMMUNITY): Payer: BLUE CROSS/BLUE SHIELD

## 2016-05-26 ENCOUNTER — Encounter (HOSPITAL_COMMUNITY): Payer: Self-pay | Admitting: Family Medicine

## 2016-05-26 DIAGNOSIS — R112 Nausea with vomiting, unspecified: Secondary | ICD-10-CM | POA: Diagnosis not present

## 2016-05-26 DIAGNOSIS — Z791 Long term (current) use of non-steroidal anti-inflammatories (NSAID): Secondary | ICD-10-CM | POA: Diagnosis not present

## 2016-05-26 DIAGNOSIS — Z79899 Other long term (current) drug therapy: Secondary | ICD-10-CM | POA: Diagnosis not present

## 2016-05-26 DIAGNOSIS — R7989 Other specified abnormal findings of blood chemistry: Secondary | ICD-10-CM | POA: Diagnosis present

## 2016-05-26 DIAGNOSIS — K8067 Calculus of gallbladder and bile duct with acute and chronic cholecystitis with obstruction: Secondary | ICD-10-CM | POA: Diagnosis present

## 2016-05-26 DIAGNOSIS — K219 Gastro-esophageal reflux disease without esophagitis: Secondary | ICD-10-CM | POA: Diagnosis present

## 2016-05-26 DIAGNOSIS — F17210 Nicotine dependence, cigarettes, uncomplicated: Secondary | ICD-10-CM | POA: Diagnosis present

## 2016-05-26 DIAGNOSIS — D72829 Elevated white blood cell count, unspecified: Secondary | ICD-10-CM | POA: Diagnosis present

## 2016-05-26 DIAGNOSIS — R945 Abnormal results of liver function studies: Secondary | ICD-10-CM

## 2016-05-26 DIAGNOSIS — F1111 Opioid abuse, in remission: Secondary | ICD-10-CM | POA: Diagnosis present

## 2016-05-26 DIAGNOSIS — K831 Obstruction of bile duct: Secondary | ICD-10-CM | POA: Diagnosis present

## 2016-05-26 DIAGNOSIS — Z23 Encounter for immunization: Secondary | ICD-10-CM | POA: Diagnosis not present

## 2016-05-26 LAB — SURGICAL PCR SCREEN
MRSA, PCR: NEGATIVE
STAPHYLOCOCCUS AUREUS: NEGATIVE

## 2016-05-26 LAB — GLUCOSE, CAPILLARY: GLUCOSE-CAPILLARY: 116 mg/dL — AB (ref 65–99)

## 2016-05-26 MED ORDER — HYDROMORPHONE HCL 1 MG/ML IJ SOLN: 0.5000 mg | INTRAMUSCULAR | Status: DC | PRN

## 2016-05-26 MED ORDER — PANTOPRAZOLE SODIUM 40 MG PO TBEC
40.0000 mg | DELAYED_RELEASE_TABLET | Freq: Every day | ORAL | Status: DC
  Administered 2016-05-26 – 2016-05-29 (×4): 40 mg via ORAL
  Filled 2016-05-26 (×4): qty 1

## 2016-05-26 MED ORDER — INFLUENZA VAC SPLIT QUAD 0.5 ML IM SUSY
0.5000 mL | PREFILLED_SYRINGE | INTRAMUSCULAR | Status: AC
  Administered 2016-05-29: 0.5 mL via INTRAMUSCULAR

## 2016-05-26 MED ORDER — ACETAMINOPHEN 650 MG RE SUPP: 650.0000 mg | Freq: Four times a day (QID) | RECTAL | Status: DC | PRN

## 2016-05-26 MED ORDER — GADOBENATE DIMEGLUMINE 529 MG/ML IV SOLN
20.0000 mL | Freq: Once | INTRAVENOUS | Status: AC | PRN
  Administered 2016-05-26: 20 mL via INTRAVENOUS

## 2016-05-26 MED ORDER — ENOXAPARIN SODIUM 40 MG/0.4ML ~~LOC~~ SOLN
40.0000 mg | SUBCUTANEOUS | Status: DC
  Administered 2016-05-26 – 2016-05-29 (×2): 40 mg via SUBCUTANEOUS
  Filled 2016-05-26 (×4): qty 0.4

## 2016-05-26 MED ORDER — POLYETHYLENE GLYCOL 3350 17 G PO PACK
17.0000 g | PACK | Freq: Every day | ORAL | Status: DC | PRN
  Administered 2016-05-28: 17 g via ORAL
  Filled 2016-05-26 (×2): qty 1

## 2016-05-26 MED ORDER — QUETIAPINE FUMARATE 50 MG PO TABS
50.0000 mg | ORAL_TABLET | Freq: Every day | ORAL | Status: DC
  Administered 2016-05-26 – 2016-05-28 (×3): 50 mg via ORAL
  Filled 2016-05-26 (×3): qty 1

## 2016-05-26 MED ORDER — PNEUMOCOCCAL VAC POLYVALENT 25 MCG/0.5ML IJ INJ: 0.5000 mL | INJECTION | INTRAMUSCULAR | Status: DC

## 2016-05-26 MED ORDER — ACETAMINOPHEN 325 MG PO TABS
650.0000 mg | ORAL_TABLET | Freq: Four times a day (QID) | ORAL | Status: DC | PRN
  Administered 2016-05-28: 650 mg via ORAL
  Filled 2016-05-26: qty 2

## 2016-05-26 MED ORDER — CYCLOBENZAPRINE HCL 10 MG PO TABS
10.0000 mg | ORAL_TABLET | Freq: Three times a day (TID) | ORAL | Status: DC | PRN
  Administered 2016-05-26 – 2016-05-29 (×7): 10 mg via ORAL
  Filled 2016-05-26 (×7): qty 1

## 2016-05-26 MED ORDER — IOPAMIDOL (ISOVUE-300) INJECTION 61%
100.0000 mL | Freq: Once | INTRAVENOUS | Status: AC | PRN
  Administered 2016-05-26: 100 mL via INTRAVENOUS

## 2016-05-26 MED ORDER — ONDANSETRON HCL 4 MG/2ML IJ SOLN
4.0000 mg | Freq: Four times a day (QID) | INTRAMUSCULAR | Status: DC | PRN
  Administered 2016-05-27: 4 mg via INTRAVENOUS
  Filled 2016-05-26: qty 2

## 2016-05-26 MED ORDER — HYDROMORPHONE HCL 1 MG/ML IJ SOLN
0.5000 mg | INTRAMUSCULAR | Status: DC | PRN
  Administered 2016-05-26 – 2016-05-28 (×12): 1 mg via INTRAVENOUS
  Filled 2016-05-26 (×3): qty 1
  Filled 2016-05-26: qty 0.5
  Filled 2016-05-26 (×4): qty 1
  Filled 2016-05-26: qty 0.5
  Filled 2016-05-26 (×4): qty 1
  Filled 2016-05-26: qty 0.5
  Filled 2016-05-26 (×2): qty 1

## 2016-05-26 MED ORDER — HYDROMORPHONE HCL 1 MG/ML IJ SOLN
1.0000 mg | Freq: Once | INTRAMUSCULAR | Status: AC
  Administered 2016-05-26: 1 mg via INTRAVENOUS
  Filled 2016-05-26: qty 1

## 2016-05-26 MED ORDER — HYDROMORPHONE HCL 1 MG/ML IJ SOLN
1.0000 mg | Freq: Once | INTRAMUSCULAR | Status: AC
  Administered 2016-05-26: 1 mg via INTRAVENOUS

## 2016-05-26 MED ORDER — HYDROMORPHONE HCL 1 MG/ML IJ SOLN
0.5000 mg | INTRAMUSCULAR | Status: DC | PRN
  Filled 2016-05-26: qty 1

## 2016-05-26 MED ORDER — SODIUM CHLORIDE 0.9 % IV SOLN
INTRAVENOUS | Status: DC
  Administered 2016-05-26 – 2016-05-28 (×5): via INTRAVENOUS

## 2016-05-26 MED ORDER — ONDANSETRON HCL 4 MG PO TABS
4.0000 mg | ORAL_TABLET | Freq: Four times a day (QID) | ORAL | Status: DC | PRN
Start: 2016-05-26 — End: 2016-05-29

## 2016-05-26 MED ORDER — ENSURE ENLIVE PO LIQD
237.0000 mL | Freq: Two times a day (BID) | ORAL | Status: DC
  Administered 2016-05-27: 237 mL via ORAL

## 2016-05-26 MED ORDER — HYDROMORPHONE HCL 1 MG/ML IJ SOLN
0.5000 mg | INTRAMUSCULAR | Status: DC | PRN
  Administered 2016-05-26: 1 mg via INTRAVENOUS

## 2016-05-26 NOTE — Progress Notes (Deleted)
Pt returned from endo for an mrcp, c/o pain but prn meds due as medicated for pain at 1606hrs before procedure, MD text paged

## 2016-05-26 NOTE — H&P (Signed)
History and Physical    [name]:[id number] DOB: 08-31-1980 DOA: 05/25/2016  PCP: Purvis Kilts, MD   Patient coming from: Home   Chief Complaint: RUQ pain, N/V, dark urine  HPI: Brad Ramirez is a 35 y.o. male with medical history significant for opiate abuse now in remission who presents to the emergency department with approximately 10 days of right upper quadrant pain with nausea and vomiting. Patient reports that he had been in his usual state of health until the insidious development of a mild discomfort in the epigastrium and right upper quadrant approximately 10 days ago. He noted that it was worse with eating and initially suspected this was related to his GERD. He had been taking Pepcid and started Protonix, but his symptoms continued to worsen. Pain has become more frequent and is beginning to last longer. He has been unable to tolerate food or beverage in the last 24 hours or so. Patient reports that his urine has become increasingly dark and his wife noted some yellowing of his eyes in the past couple days. He denies any significant alcohol use and has not recently started any new medications. He denies any history of gallbladder issues. He denies fevers or chills. There has been no recent long distance travel or sick contacts. There is no family history of GI malignancy.  ED Course: Upon arrival to the ED, patient is found to be afebrile, saturating well on room air, and with vital signs otherwise stable. Chemistry panel is notable for alkaline phosphatase of 250, AST 235, ALT 499, and total bilirubin 8.3. CBC features a mild leukocytosis to 11,700. Urinalysis is notable only for large bilirubin. CT of the abdomen and pelvis was obtained and features dilated intra-and extrahepatic bile ducts with a slight protrusion of the CBD into the second portion of the [place] them. There is no focal mass or choledocholithiasis on the CT and differential per radiology includes  stenosis at the level of the ampulla versus a choledochal cyst. Patient was provided supportive care with IV fluid hydration and analgesics. GI was consulted by the ED physician and advised a medical admission to [facility] where gastroenterology will consult. Patient remained hemodynamically stable in the ED and in no respiratory distress. He'll be admitted to the medical-surgical unit at [facility] for ongoing evaluation and management of biliary obstruction of undetermined origin.  Review of Systems:  All other systems reviewed and apart from HPI, are negative.  Past Medical History:  Diagnosis Date  . Opioid abuse     History reviewed. No pertinent surgical history.   reports that he has been smoking Cigarettes.  He has been smoking about 0.50 packs per day. He has never used smokeless tobacco. He reports that he drinks alcohol. He reports that he does not use drugs.  No Known Allergies  History reviewed. No pertinent family history.   Prior to Admission medications   Medication Sig Start Date End Date Taking? Authorizing Provider  cyclobenzaprine (FLEXERIL) 10 MG tablet Take 1 tablet (10 mg total) by mouth 3 (three) times daily as needed for muscle spasms. 11/28/14   Jola Schmidt, MD  famotidine (PEPCID) 20 MG tablet Take 1 tablet (20 mg total) by mouth 2 (two) times daily. 11/28/14   Fredia Sorrow, MD  naproxen (NAPROSYN) 500 MG tablet Take 1 tablet (500 mg total) by mouth 2 (two) times daily. 11/28/14   Fredia Sorrow, MD  omeprazole (PRILOSEC) 20 MG capsule Take 1 capsule (20 mg  total) by mouth 2 (two) times daily before a meal. 11/28/14   Jola Schmidt, MD  QUEtiapine (SEROQUEL) 50 MG tablet Take 50 mg by mouth at bedtime. 11/24/14   Historical Provider, MD    Physical Exam: Vitals:   05/26/16 0056 05/26/16 0218 05/26/16 0339 05/26/16 0445  BP: 130/76 109/71 102/62 106/61  Pulse: 64 (!) 56 (!) 57 (!) 57  Resp: _0 Temp: 98.8 F (37.1 C)     TempSrc:  Oral     SpO2: 100% 100% 98% 98%  Weight:      Height:          Constitutional: NAD, calm, appears uncomfortable Eyes: PERTLA, lids and conjunctivae normal. Scleral icterus. ENMT: Mucous membranes are dry. Posterior pharynx clear of any exudate or lesions.   Neck: normal, supple, no masses, no thyromegaly Respiratory: clear to auscultation bilaterally, no wheezing, no crackles. Normal respiratory effort.    Cardiovascular: S1 & S2 heard, regular rate and rhythm, no significant murmur. No extremity edema. 2+ pedal pulses. No significant JVD. Abdomen: No distension, tender in RUQ without rebound pain or guarding, no masses palpated. Bowel sounds normal.  Musculoskeletal: no clubbing / cyanosis. No joint deformity upper and lower extremities. Normal muscle tone.  Skin: no significant rashes, lesions, ulcers. Warm, dry, well-perfused. Jaundiced. Faint ecchymoses to RLE. Neurologic: CN 2-12 grossly intact. Sensation intact, DTR normal. Strength 5/5 in all 4 limbs.  Psychiatric: Normal judgment and insight. Alert and oriented x 3. Normal mood and affect.     Labs on Admission: I have personally reviewed following labs and imaging studies  CBC:  Recent Labs Lab 05/25/16 2310  WBC 11.7*  NEUTROABS 6.4  HGB 14.2  HCT 41.9  MCV 86.0  PLT 562   Basic Metabolic Panel:  Recent Labs Lab 05/25/16 2310  NA 137  K 3.6  CL 104  CO2 25  GLUCOSE 102*  BUN 15  CREATININE 0.81  CALCIUM 9.4   GFR: Estimated Creatinine Clearance: 149.4 mL/min (by C-G formula based on SCr of 0.81 mg/dL). Liver Function Tests:  Recent Labs Lab 05/25/16 2310  AST 235*  ALT 499*  ALKPHOS 250*  BILITOT 8.3*  PROT 7.5  ALBUMIN 4.1    Recent Labs Lab 05/25/16 2310  LIPASE 40   No results for input(s): AMMONIA in the last 168 hours. Coagulation Profile: No results for input(s): INR, PROTIME in the last 168 hours. Cardiac Enzymes: No results for input(s): CKTOTAL, CKMB, CKMBINDEX, TROPONINI  in the last 168 hours. BNP (last 3 results) No results for input(s): PROBNP in the last 8760 hours. HbA1C: No results for input(s): HGBA1C in the last 72 hours. CBG: No results for input(s): GLUCAP in the last 168 hours. Lipid Profile: No results for input(s): CHOL, HDL, LDLCALC, TRIG, CHOLHDL, LDLDIRECT in the last 72 hours. Thyroid Function Tests: No results for input(s): TSH, T4TOTAL, FREET4, T3FREE, THYROIDAB in the last 72 hours. Anemia Panel: No results for input(s): VITAMINB12, FOLATE, FERRITIN, TIBC, IRON, RETICCTPCT in the last 72 hours. Urine analysis:    Component Value Date/Time   COLORURINE YELLOW 05/25/2016 2058   APPEARANCEUR CLEAR 05/25/2016 2058   LABSPEC 1.010 05/25/2016 2058   PHURINE 7.0 05/25/2016 2058   GLUCOSEU NEGATIVE 05/25/2016 2058   HGBUR NEGATIVE 05/25/2016 2058   BILIRUBINUR LARGE (A) 05/25/2016 2058   [place] NEGATIVE 05/25/2016 2058   PROTEINUR NEGATIVE 05/25/2016 2058   NITRITE NEGATIVE 05/25/2016 2058   LEUKOCYTESUR NEGATIVE 05/25/2016 2058   Sepsis Labs: _1 (procalcitonin:4,lacticidven:4) )  No results found for this or any previous visit (from the past 240 hour(s)).   Radiological Exams on Admission: Ct Abdomen Pelvis W Contrast  Result Date: 05/26/2016 CLINICAL DATA:  Right flank pain, right upper quadrant pain with nausea and vomiting for 2 weeks. Dark colored urine and difficulty urinating. EXAM: CT ABDOMEN AND PELVIS WITH CONTRAST TECHNIQUE: Multidetector CT imaging of the abdomen and pelvis was performed using the standard protocol following bolus administration of intravenous contrast. CONTRAST:  176m ISOVUE-300 IOPAMIDOL (ISOVUE-300) INJECTION 61% COMPARISON:  11/28/2014 CT. FINDINGS: Lower chest: Respiratory motion limits assessment. No pneumonic consolidation, effusion or pneumothorax. Normal sized cardiac chambers to the extent visible. No pericardial effusion. Hepatobiliary: There is intra and extrahepatic biliary dilatation  which is a new finding since prior exam. The common bile duct is distended measuring up to 11 mm in caliber distal and projects into the lumen of the second portion of the duodenum. Differential possibilities may include a stricture at the ampulla or potentially a choledochal cyst variant. No definite obstructing calculus. The gallbladder is not distended. Pancreas: No pancreatic ductal dilatation or definite mass. Spleen: Small splenule is seen near the splenic hilum. There is no splenomegaly. Adrenals/Urinary Tract: Normal adrenal glands. No obstructive uropathy of the kidneys. No enhancing renal mass. No nephrolithiasis. Urinary bladder is distended without calculus nor wall thickening. Stomach/Bowel: No bowel obstruction or acute inflammation. Normal-appearing appendix. Normal bowel rotation. Vascular/Lymphatic: No significant vascular findings are present. No enlarged abdominal or pelvic lymph nodes. Reproductive: Prostate is unremarkable. Other: No abdominal wall hernia or abnormality. No abdominopelvic ascites. Musculoskeletal: No acute or significant osseous findings. IMPRESSION: Dilated intra and extrahepatic bile ducts with slight protrusion of the distal common bile duct into the second portion of the duodenum. Differential possibilities may include stenosis at the ampulla or possibly a choledochal cyst variant though this finding was not present in 2016 (Ser. 4, Image 36). No choledocholithiasis is apparent. No focal mass is identified. Electronically Signed   By: DAshley RoyaltyM.D.   On: 05/26/2016 01:18    EKG: Not performed, will obtain as appropriate.   Assessment/Plan  1. Biliary obstruction  - Pt presents with ~10 days of RUQ pain with darkening of urine and scleral icterus - There is elevation in alk phos, transaminases, and bilirubin  - CT abd reveals intra- and extrahepatic biliary dilation likely secondary to stricture at level of ampulla vs cyst; no stone seen  - There is a mild  leukocytosis, but no fever; pt is non-toxic appearing  - GI is consulting and much appreciated  - [facility] - Will keep NPO for now and follow-up GI recommendations    2. Leukocytosis  - WBC is 11,700 on admission, likely reactive to the biliary obstruction and vomiting  - There is no fever and pt is non-toxic in appearance  - Culture if febrile; plan to watch of off abx for now pending GI eval    3. GERD - Managed with Pepcid at home, will convert to IV for now     DVT prophylaxis: sq Lovenox  Code Status: Full  Family Communication: Wife updated at bedside [facility] Admission status: Inpatient    TVianne Bulls MD [facility] Pager [phone] If 7PM-7AM, please contact night-coverage [email or web address] Password [email or web address]  05/26/2016, 5:03 AM

## 2016-05-26 NOTE — Progress Notes (Signed)
Pt returned from MRI c/o pain but prn meds not due as pt medicated just before procedure at 1606hrs,MD txt paged and waiting for response

## 2016-05-26 NOTE — Progress Notes (Signed)
MD gave a one time order for pain medication , dilaudid 1mg  iv. V/s checked and within therapeutic limits as charted

## 2016-05-26 NOTE — Progress Notes (Signed)
Patient seen and examined. Admitted after midnight secondary to abd pain and jaundice. Found to have intra and extra hepatic biliary obstruction. Currently with elevated LFT's and bilirubin (8.3). No findings on CT suggesting gallstones or malignancy. Patient seen by GI and with plans for MRCP and subsequent ERCP. Please refer to H&P written by Dr. Antionette Charpyd for further info/details on admission.   Plan: -MRCP today -ERCP tomorrow by Dr. Madilyn FiremanHayes  -allow to have soft diet per GI rec's -continue supportive care and PRN analgesics and antiemetics.  Brad Ramirez, Brad Ramirez [phone]

## 2016-05-26 NOTE — Consult Note (Signed)
[facility] Consult Note  Referring Provider: No ref. provider found Primary Care Physician:  Purvis Kilts, MD Primary Gastroenterologist:  Dr.  Laurel Dimmer Complaint: Abdominal pain and jaundice HPI: Brad Ramirez is an 35 y.o. white male  previously healthy who presents the two-week history of rapidly increasing epigastric pain nausea vomiting and jaundice. He denies any fever or chills. He was found to have a bilirubin of 8 on presentation to [facility] and was transferred here after CT scan showed intra-and extrahepatic bile duct distention with no definite stone or tumor.  Past Medical History:  Diagnosis Date  . Opioid abuse     History reviewed. No pertinent surgical history.  Medications Prior to Admission  Medication Sig Dispense Refill  . cyclobenzaprine (FLEXERIL) 10 MG tablet Take 1 tablet (10 mg total) by mouth 3 (three) times daily as needed for muscle spasms. 15 tablet 0  . famotidine (PEPCID) 20 MG tablet Take 1 tablet (20 mg total) by mouth 2 (two) times daily. 14 tablet 1  . naproxen (NAPROSYN) 500 MG tablet Take 1 tablet (500 mg total) by mouth 2 (two) times daily. 14 tablet 0  . omeprazole (PRILOSEC) 20 MG capsule Take 1 capsule (20 mg total) by mouth 2 (two) times daily before a meal. 60 capsule 0  . QUEtiapine (SEROQUEL) 50 MG tablet Take 50 mg by mouth at bedtime.  2    Allergies: No Known Allergies  Family History  Problem Relation Age of Onset  . Appendicitis Father   . Liver disease Neg Hx     Social History:  reports that he has been smoking Cigarettes.  He has been smoking about 0.50 packs per day. He has never used smokeless tobacco. He reports that he drinks alcohol. He reports that he does not use drugs.  Review of Systems: negative except As above   Blood pressure 106/65, pulse (!) 56, temperature 97.7 F (36.5 C), temperature source Oral, resp. rate 17, height 6' 2"  (1.88 m), weight 94.9 kg (209 lb 3.2 oz), SpO2 100  %. Head: Normocephalic, without obvious abnormality, atraumatic Neck: no adenopathy, no carotid bruit, no JVD, supple, symmetrical, trachea midline and thyroid not enlarged, symmetric, no tenderness/mass/nodules Resp: clear to auscultation bilaterally Cardio: regular rate and rhythm, S1, S2 normal, no murmur, click, rub or gallop GI: Abdomen soft nondistended with normoactive bowel sounds. No hepatosplenomegaly mass or guarding. Extremities: extremities normal, atraumatic, no cyanosis or edema  Results for orders placed or performed during the hospital encounter of 05/25/16 (from the past 48 hour(s))  Urinalysis, Routine w reflex microscopic- may I&O cath if menses     Status: Abnormal   Collection Time: 05/25/16  8:58 PM  Result Value Ref Range   Color, Urine YELLOW YELLOW   APPearance CLEAR CLEAR   Specific Gravity, Urine 1.010 1.005 - 1.030   pH 7.0 5.0 - 8.0   Glucose, UA NEGATIVE NEGATIVE mg/dL   Hgb urine dipstick NEGATIVE NEGATIVE   Bilirubin Urine LARGE (A) NEGATIVE   Ketones, ur NEGATIVE NEGATIVE mg/dL   Protein, ur NEGATIVE NEGATIVE mg/dL   Nitrite NEGATIVE NEGATIVE   Leukocytes, UA NEGATIVE NEGATIVE    Comment: MICROSCOPIC NOT DONE ON URINES WITH NEGATIVE PROTEIN, BLOOD, LEUKOCYTES, NITRITE, OR GLUCOSE <1000 mg/dL.  CBC with Differential     Status: Abnormal   Collection Time: 05/25/16 11:10 PM  Result Value Ref Range   WBC 11.7 (H) 4.0 - 10.5 K/uL   RBC 4.87 4.22 - 5.81 MIL/uL   Hemoglobin  14.2 13.0 - 17.0 g/dL   HCT 41.9 39.0 - 52.0 %   MCV 86.0 78.0 - 100.0 fL   MCH 29.2 26.0 - 34.0 pg   MCHC 33.9 30.0 - 36.0 g/dL   RDW 15.4 11.5 - 15.5 %   Platelets 310 150 - 400 K/uL   Neutrophils Relative % 55 %   Neutro Abs 6.4 1.7 - 7.7 K/uL   Lymphocytes Relative 35 %   Lymphs Abs 4.1 (H) 0.7 - 4.0 K/uL   Monocytes Relative 8 %   Monocytes Absolute 0.9 0.1 - 1.0 K/uL   Eosinophils Relative 2 %   Eosinophils Absolute 0.2 0.0 - 0.7 K/uL   Basophils Relative 0 %    Basophils Absolute 0.0 0.0 - 0.1 K/uL  Comprehensive metabolic panel     Status: Abnormal   Collection Time: 05/25/16 11:10 PM  Result Value Ref Range   Sodium 137 135 - 145 mmol/L   Potassium 3.6 3.5 - 5.1 mmol/L   Chloride 104 101 - 111 mmol/L   CO2 25 22 - 32 mmol/L   Glucose, Bld 102 (H) 65 - 99 mg/dL   BUN 15 6 - 20 mg/dL   Creatinine, Ser 0.81 0.61 - 1.24 mg/dL   Calcium 9.4 8.9 - 10.3 mg/dL   Total Protein 7.5 6.5 - 8.1 g/dL   Albumin 4.1 3.5 - 5.0 g/dL   AST 235 (H) 15 - 41 U/L   ALT 499 (H) 17 - 63 U/L   Alkaline Phosphatase 250 (H) 38 - 126 U/L   Total Bilirubin 8.3 (H) 0.3 - 1.2 mg/dL   GFR calc non Af Amer >60 >60 mL/min   GFR calc Af Amer >60 >60 mL/min    Comment: (NOTE) The eGFR has been calculated using the CKD EPI equation. This calculation has not been validated in all clinical situations. eGFR's persistently <60 mL/min signify possible Chronic Kidney Disease.    Anion gap 8 5 - 15  Lipase, blood     Status: None   Collection Time: 05/25/16 11:10 PM  Result Value Ref Range   Lipase 40 11 - 51 U/L  Glucose, capillary     Status: Abnormal   Collection Time: 05/26/16  7:29 AM  Result Value Ref Range   Glucose-Capillary 116 (H) 65 - 99 mg/dL   Comment 1 Notify RN    Ct Abdomen Pelvis W Contrast  Result Date: 05/26/2016 CLINICAL DATA:  Right flank pain, right upper quadrant pain with nausea and vomiting for 2 weeks. Dark colored urine and difficulty urinating. EXAM: CT ABDOMEN AND PELVIS WITH CONTRAST TECHNIQUE: Multidetector CT imaging of the abdomen and pelvis was performed using the standard protocol following bolus administration of intravenous contrast. CONTRAST:  176m ISOVUE-300 IOPAMIDOL (ISOVUE-300) INJECTION 61% COMPARISON:  11/28/2014 CT. FINDINGS: Lower chest: Respiratory motion limits assessment. No pneumonic consolidation, effusion or pneumothorax. Normal sized cardiac chambers to the extent visible. No pericardial effusion. Hepatobiliary: There is  intra and extrahepatic biliary dilatation which is a new finding since prior exam. The common bile duct is distended measuring up to 11 mm in caliber distal and projects into the lumen of the second portion of the duodenum. Differential possibilities may include a stricture at the ampulla or potentially a choledochal cyst variant. No definite obstructing calculus. The gallbladder is not distended. Pancreas: No pancreatic ductal dilatation or definite mass. Spleen: Small splenule is seen near the splenic hilum. There is no splenomegaly. Adrenals/Urinary Tract: Normal adrenal glands. No obstructive uropathy of the  kidneys. No enhancing renal mass. No nephrolithiasis. Urinary bladder is distended without calculus nor wall thickening. Stomach/Bowel: No bowel obstruction or acute inflammation. Normal-appearing appendix. Normal bowel rotation. Vascular/Lymphatic: No significant vascular findings are present. No enlarged abdominal or pelvic lymph nodes. Reproductive: Prostate is unremarkable. Other: No abdominal wall hernia or abnormality. No abdominopelvic ascites. Musculoskeletal: No acute or significant osseous findings. IMPRESSION: Dilated intra and extrahepatic bile ducts with slight protrusion of the distal common bile duct into the second portion of the duodenum. Differential possibilities may include stenosis at the ampulla or possibly a choledochal cyst variant though this finding was not present in 2016 (Ser. 4, Image 36). No choledocholithiasis is apparent. No focal mass is identified. Electronically Signed   By: Ashley Royalty M.D.   On: 05/26/2016 01:18    Assessment: Intra-and extra hepatic biliary obstruction, etiology unclear by CT scan Plan:  Will need ERCP. Scheduled for tomorrow. We'll obtain MRCP today to better clarify etiology. Risks rationale and alternatives to ERCP were explained the patient and family and they wished to proceed. Kynedi Profitt C 05/26/2016, 8:48 AM  Pager [phone] If no  answer or after 5 PM call [phone]

## 2016-05-27 ENCOUNTER — Inpatient Hospital Stay (HOSPITAL_COMMUNITY): Payer: BLUE CROSS/BLUE SHIELD | Admitting: Certified Registered Nurse Anesthetist

## 2016-05-27 ENCOUNTER — Encounter (HOSPITAL_COMMUNITY): Payer: Self-pay | Admitting: Certified Registered Nurse Anesthetist

## 2016-05-27 ENCOUNTER — Inpatient Hospital Stay (HOSPITAL_COMMUNITY): Payer: BLUE CROSS/BLUE SHIELD

## 2016-05-27 ENCOUNTER — Encounter (HOSPITAL_COMMUNITY)
Admission: EM | Disposition: A | Discharge status: Home / Self Care | Payer: Self-pay | Source: Home / Self Care | Attending: Internal Medicine

## 2016-05-27 DIAGNOSIS — K831 Obstruction of bile duct: Secondary | ICD-10-CM

## 2016-05-27 HISTORY — PX: ERCP: SHX5425

## 2016-05-27 LAB — GLUCOSE, CAPILLARY: Glucose-Capillary: 104 mg/dL — ABNORMAL HIGH (ref 65–99)

## 2016-05-27 SURGERY — ERCP, WITH INTERVENTION IF INDICATED
Anesthesia: General

## 2016-05-27 MED ORDER — CIPROFLOXACIN IN D5W 400 MG/200ML IV SOLN
INTRAVENOUS | Status: AC
  Filled 2016-05-27: qty 200

## 2016-05-27 MED ORDER — GLUCAGON HCL RDNA (DIAGNOSTIC) 1 MG IJ SOLR
INTRAMUSCULAR | Status: AC
  Filled 2016-05-27: qty 2

## 2016-05-27 MED ORDER — IOPAMIDOL (ISOVUE-300) INJECTION 61%
INTRAVENOUS | Status: AC
  Filled 2016-05-27: qty 50

## 2016-05-27 MED ORDER — LACTATED RINGERS IV SOLN
INTRAVENOUS | Status: DC | PRN
  Administered 2016-05-27: 13:00:00 via INTRAVENOUS

## 2016-05-27 MED ORDER — NICOTINE 14 MG/24HR TD PT24
14.0000 mg | MEDICATED_PATCH | Freq: Every day | TRANSDERMAL | Status: DC
  Administered 2016-05-27 – 2016-05-28 (×2): 14 mg via TRANSDERMAL
  Filled 2016-05-27 (×2): qty 1

## 2016-05-27 MED ORDER — MIDAZOLAM HCL 5 MG/5ML IJ SOLN
INTRAMUSCULAR | Status: DC | PRN
  Administered 2016-05-27: 2 mg via INTRAVENOUS

## 2016-05-27 MED ORDER — PROPOFOL 10 MG/ML IV BOLUS
INTRAVENOUS | Status: DC | PRN
  Administered 2016-05-27: 200 mg via INTRAVENOUS

## 2016-05-27 MED ORDER — LACTATED RINGERS IV SOLN
INTRAVENOUS | Status: DC | PRN
Start: 2016-05-27 — End: 2016-05-27

## 2016-05-27 MED ORDER — SUGAMMADEX SODIUM 500 MG/5ML IV SOLN
INTRAVENOUS | Status: DC | PRN
  Administered 2016-05-27: 400 mg via INTRAVENOUS

## 2016-05-27 MED ORDER — LIDOCAINE HCL (CARDIAC) 20 MG/ML IV SOLN
INTRAVENOUS | Status: DC | PRN
  Administered 2016-05-27: 80 mg via INTRAVENOUS

## 2016-05-27 MED ORDER — SUCCINYLCHOLINE CHLORIDE 200 MG/10ML IV SOSY
PREFILLED_SYRINGE | INTRAVENOUS | Status: DC | PRN
  Administered 2016-05-27: 120 mg via INTRAVENOUS

## 2016-05-27 MED ORDER — FENTANYL CITRATE (PF) 100 MCG/2ML IJ SOLN
INTRAMUSCULAR | Status: DC | PRN
  Administered 2016-05-27: 50 ug via INTRAVENOUS

## 2016-05-27 MED ORDER — SODIUM CHLORIDE 0.9 % IV SOLN
INTRAVENOUS | Status: DC | PRN
  Administered 2016-05-27: 30 mL

## 2016-05-27 MED ORDER — CIPROFLOXACIN IN D5W 400 MG/200ML IV SOLN
INTRAVENOUS | Status: DC | PRN
  Administered 2016-05-27: 400 mg via INTRAVENOUS

## 2016-05-27 MED ORDER — ROCURONIUM BROMIDE 100 MG/10ML IV SOLN
INTRAVENOUS | Status: DC | PRN
  Administered 2016-05-27: 50 mg via INTRAVENOUS

## 2016-05-27 MED ORDER — SODIUM CHLORIDE 0.9 % IV SOLN
INTRAVENOUS | Status: DC
  Administered 2016-05-27 (×2): via INTRAVENOUS

## 2016-05-27 NOTE — Consult Note (Signed)
Reason for Consult:symtomatic cholelithiasis Referring Physician: Dr. Teena Irani  Brad Ramirez is an 35 y.o. male.  HPI: This is a pleasant gentleman who is transferred down from [facility] yesterday for choledocholithiasis. He originally presented with a ten-day history of abdominal pain. He was found to be jaundiced. He was found to have elevated bilirubin. He underwent an ERCP today by Dr. Amedeo Plenty and a common bile duct stone  was removed.  Currently, the patient feels much better and has almost no abdominal pain. His wife reports that his jaundice has significantly improved. He is already walking in the hallways. He may have had some mild gallbladder attacks in the past. He is otherwise healthy without complaints.  Past Medical History:  Diagnosis Date  . Opioid abuse     Past Surgical History:  Procedure Laterality Date  . TYMPANOSTOMY TUBE PLACEMENT Bilateral 1980s    Family History  Problem Relation Age of Onset  . Appendicitis Father   . Liver disease Neg Hx     Social History:  reports that he has been smoking Cigarettes.  He has a 8.50 pack-year smoking history. He has never used smokeless tobacco. He reports that he drinks alcohol. He reports that he does not use drugs.  Allergies: No Known Allergies  Medications: I have reviewed the patient's current medications.  Results for orders placed or performed during the hospital encounter of 05/25/16 (from the past 48 hour(s))  CBC with Differential     Status: Abnormal   Collection Time: 05/25/16 11:10 PM  Result Value Ref Range   WBC 11.7 (H) 4.0 - 10.5 K/uL   RBC 4.87 4.22 - 5.81 MIL/uL   Hemoglobin 14.2 13.0 - 17.0 g/dL   HCT 41.9 39.0 - 52.0 %   MCV 86.0 78.0 - 100.0 fL   MCH 29.2 26.0 - 34.0 pg   MCHC 33.9 30.0 - 36.0 g/dL   RDW 15.4 11.5 - 15.5 %   Platelets 310 150 - 400 K/uL   Neutrophils Relative % 55 %   Neutro Abs 6.4 1.7 - 7.7 K/uL   Lymphocytes Relative 35 %   Lymphs Abs 4.1 (H) 0.7 - 4.0 K/uL    Monocytes Relative 8 %   Monocytes Absolute 0.9 0.1 - 1.0 K/uL   Eosinophils Relative 2 %   Eosinophils Absolute 0.2 0.0 - 0.7 K/uL   Basophils Relative 0 %   Basophils Absolute 0.0 0.0 - 0.1 K/uL  Comprehensive metabolic panel     Status: Abnormal   Collection Time: 05/25/16 11:10 PM  Result Value Ref Range   Sodium 137 135 - 145 mmol/L   Potassium 3.6 3.5 - 5.1 mmol/L   Chloride 104 101 - 111 mmol/L   CO2 25 22 - 32 mmol/L   Glucose, Bld 102 (H) 65 - 99 mg/dL   BUN 15 6 - 20 mg/dL   Creatinine, Ser 0.81 0.61 - 1.24 mg/dL   Calcium 9.4 8.9 - 10.3 mg/dL   Total Protein 7.5 6.5 - 8.1 g/dL   Albumin 4.1 3.5 - 5.0 g/dL   AST 235 (H) 15 - 41 U/L   ALT 499 (H) 17 - 63 U/L   Alkaline Phosphatase 250 (H) 38 - 126 U/L   Total Bilirubin 8.3 (H) 0.3 - 1.2 mg/dL   GFR calc non Af Amer >60 >60 mL/min   GFR calc Af Amer >60 >60 mL/min    Comment: (NOTE) The eGFR has been calculated using the CKD EPI equation. This calculation has not  been validated in all clinical situations. eGFR's persistently <60 mL/min signify possible Chronic Kidney Disease.    Anion gap 8 5 - 15  Lipase, blood     Status: None   Collection Time: 05/25/16 11:10 PM  Result Value Ref Range   Lipase 40 11 - 51 U/L  Glucose, capillary     Status: Abnormal   Collection Time: 05/26/16  7:29 AM  Result Value Ref Range   Glucose-Capillary 116 (H) 65 - 99 mg/dL   Comment 1 Notify RN   Surgical pcr screen     Status: None   Collection Time: 05/26/16  8:33 PM  Result Value Ref Range   MRSA, PCR NEGATIVE NEGATIVE   Staphylococcus aureus NEGATIVE NEGATIVE    Comment:        The Xpert SA Assay (FDA approved for NASAL specimens in patients over 65 years of age), is one component of a comprehensive surveillance program.  Test performance has been validated by [facility] for patients greater than or equal to 38 year old. It is not intended to diagnose infection nor to guide or monitor treatment.   Glucose,  capillary     Status: Abnormal   Collection Time: 05/27/16  7:46 AM  Result Value Ref Range   Glucose-Capillary 104 (H) 65 - 99 mg/dL    Ct Abdomen Pelvis W Contrast  Result Date: 05/26/2016 CLINICAL DATA:  Right flank pain, right upper quadrant pain with nausea and vomiting for 2 weeks. Dark colored urine and difficulty urinating. EXAM: CT ABDOMEN AND PELVIS WITH CONTRAST TECHNIQUE: Multidetector CT imaging of the abdomen and pelvis was performed using the standard protocol following bolus administration of intravenous contrast. CONTRAST:  130m ISOVUE-300 IOPAMIDOL (ISOVUE-300) INJECTION 61% COMPARISON:  11/28/2014 CT. FINDINGS: Lower chest: Respiratory motion limits assessment. No pneumonic consolidation, effusion or pneumothorax. Normal sized cardiac chambers to the extent visible. No pericardial effusion. Hepatobiliary: There is intra and extrahepatic biliary dilatation which is a new finding since prior exam. The common bile duct is distended measuring up to 11 mm in caliber distal and projects into the lumen of the second portion of the duodenum. Differential possibilities may include a stricture at the ampulla or potentially a choledochal cyst variant. No definite obstructing calculus. The gallbladder is not distended. Pancreas: No pancreatic ductal dilatation or definite mass. Spleen: Small splenule is seen near the splenic hilum. There is no splenomegaly. Adrenals/Urinary Tract: Normal adrenal glands. No obstructive uropathy of the kidneys. No enhancing renal mass. No nephrolithiasis. Urinary bladder is distended without calculus nor wall thickening. Stomach/Bowel: No bowel obstruction or acute inflammation. Normal-appearing appendix. Normal bowel rotation. Vascular/Lymphatic: No significant vascular findings are present. No enlarged abdominal or pelvic lymph nodes. Reproductive: Prostate is unremarkable. Other: No abdominal wall hernia or abnormality. No abdominopelvic ascites. Musculoskeletal: No  acute or significant osseous findings. IMPRESSION: Dilated intra and extrahepatic bile ducts with slight protrusion of the distal common bile duct into the second portion of the duodenum. Differential possibilities may include stenosis at the ampulla or possibly a choledochal cyst variant though this finding was not present in 2016 (Ser. 4, Image 36). No choledocholithiasis is apparent. No focal mass is identified. Electronically Signed   By: DAshley RoyaltyM.D.   On: 05/26/2016 01:18   Mr 3d Recon At Scanner  Result Date: 05/26/2016 CLINICAL DATA:  Abdominal pain, jaundice and abnormal CT scan. EXAM: MRI ABDOMEN WITHOUT AND WITH CONTRAST (INCLUDING MRCP) TECHNIQUE: Multiplanar multisequence MR imaging of the abdomen was performed both before  and after the administration of intravenous contrast. Heavily T2-weighted images of the biliary and pancreatic ducts were obtained, and three-dimensional MRCP images were rendered by post processing. CONTRAST:  11m MULTIHANCE GADOBENATE DIMEGLUMINE 529 MG/ML IV SOLN COMPARISON:  CT scan 05/26/2016 and prior CT scan 11/28/2014 FINDINGS: Lower chest: The lung bases are grossly clear. No infiltrates or effusion. No pericardial effusion. Hepatobiliary: No focal hepatic lesions are identified. There is intrahepatic biliary dilatation as demonstrated on the recent CT scan. There is also moderate common bile duct dilatation measuring a maximum of 10 mm. The common bile duct protrudes into the duodenum but I do not see any evidence of a ampullary mass or pancreatic mass. The cholangiogram images are somewhat limited by motion but findings are suspicious for small distal common bile duct stone best seen on series 6, image 26 and also on series 12, image number 4. Numerous small layering gallstones are noted in the gallbladder. Pancreas:  No mass, inflammation or ductal dilatation. Spleen:  Normal size.  No focal lesions. Adrenals/Urinary Tract:  The adrenal glands and kidneys are  normal. Stomach/Bowel: The stomach, duodenum, visualized small bowel and visualize colon are grossly normal. Vascular/Lymphatic: No pathologically enlarged lymph nodes identified. No abdominal aortic aneurysm demonstrated. Other:  No ascites or abdominal wall hernia. Musculoskeletal: No significant bony findings. IMPRESSION: 1. Intra and extrahepatic biliary dilatation likely due to a small focal distal common bile duct stone. 2. Numerous small layering gallstones in gallbladder. 3. Normal pancreatic duct and no pancreatic head mass or ampullary lesion. Electronically Signed   By: PMarijo SanesM.D.   On: 05/26/2016 19:42   Dg Ercp Biliary & Pancreatic Ducts  Result Date: 05/27/2016 CLINICAL DATA:  Bile duct stone EXAM: ERCP TECHNIQUE: Multiple spot images obtained with the fluoroscopic device and submitted for interpretation post-procedure. FLUOROSCOPY TIME:  Fluoroscopy Time:  1 minutes and 25 second Radiation Exposure Index (if provided by the fluoroscopic device): Number of Acquired Spot Images: Four COMPARISON:  None. FINDINGS: Contrast fills the common bile duct. Balloon stone retrieval is documented on the images. IMPRESSION: See above. These images were submitted for radiologic interpretation only. Please see the procedural report for the amount of contrast and the fluoroscopy time utilized. Electronically Signed   By: AMarybelle KillingsM.D.   On: 05/27/2016 13:57   Mr Abdomen Mrcp WMoise BoringContast  Result Date: 05/26/2016 CLINICAL DATA:  Abdominal pain, jaundice and abnormal CT scan. EXAM: MRI ABDOMEN WITHOUT AND WITH CONTRAST (INCLUDING MRCP) TECHNIQUE: Multiplanar multisequence MR imaging of the abdomen was performed both before and after the administration of intravenous contrast. Heavily T2-weighted images of the biliary and pancreatic ducts were obtained, and three-dimensional MRCP images were rendered by post processing. CONTRAST:  279mMULTIHANCE GADOBENATE DIMEGLUMINE 529 MG/ML IV SOLN COMPARISON:   CT scan 05/26/2016 and prior CT scan 11/28/2014 FINDINGS: Lower chest: The lung bases are grossly clear. No infiltrates or effusion. No pericardial effusion. Hepatobiliary: No focal hepatic lesions are identified. There is intrahepatic biliary dilatation as demonstrated on the recent CT scan. There is also moderate common bile duct dilatation measuring a maximum of 10 mm. The common bile duct protrudes into the duodenum but I do not see any evidence of a ampullary mass or pancreatic mass. The cholangiogram images are somewhat limited by motion but findings are suspicious for small distal common bile duct stone best seen on series 6, image 26 and also on series 12, image number 4. Numerous small layering gallstones are noted in the gallbladder.  Pancreas:  No mass, inflammation or ductal dilatation. Spleen:  Normal size.  No focal lesions. Adrenals/Urinary Tract:  The adrenal glands and kidneys are normal. Stomach/Bowel: The stomach, duodenum, visualized small bowel and visualize colon are grossly normal. Vascular/Lymphatic: No pathologically enlarged lymph nodes identified. No abdominal aortic aneurysm demonstrated. Other:  No ascites or abdominal wall hernia. Musculoskeletal: No significant bony findings. IMPRESSION: 1. Intra and extrahepatic biliary dilatation likely due to a small focal distal common bile duct stone. 2. Numerous small layering gallstones in gallbladder. 3. Normal pancreatic duct and no pancreatic head mass or ampullary lesion. Electronically Signed   By: Marijo Sanes M.D.   On: 05/26/2016 19:42    Review of Systems  Constitutional: Negative for chills and fever.  HENT: Negative for congestion and sore throat.   Respiratory: Negative for cough and shortness of breath.   Cardiovascular: Negative for chest pain.  Gastrointestinal: Positive for abdominal pain. Negative for diarrhea, nausea and vomiting.  Genitourinary: Positive for dysuria.  Neurological: Negative for weakness.  All other  systems reviewed and are negative.  Blood pressure (!) 104/52, pulse 60, temperature 98.3 F (36.8 C), temperature source Oral, resp. rate 17, height 6' 2"  (1.88 m), weight 94.9 kg (209 lb 3.2 oz), SpO2 98 %. Physical Exam  Constitutional: He is oriented to person, place, and time. He appears well-developed and well-nourished. No distress.  HENT:  Head: Normocephalic and atraumatic.  Right Ear: External ear normal.  Left Ear: External ear normal.  Nose: Nose normal.  Mouth/Throat: Oropharynx is clear and moist. No oropharyngeal exudate.  Eyes: Conjunctivae are normal. Pupils are equal, round, and reactive to light. Right eye exhibits no discharge. Left eye exhibits no discharge. Scleral icterus is present.  Neck: Normal range of motion. No tracheal deviation present.  Cardiovascular: Normal rate, regular rhythm, normal heart sounds and intact distal pulses.   No murmur heard. Respiratory: Effort normal and breath sounds normal. No respiratory distress. He has no wheezes. He exhibits no tenderness.  GI: Soft. He exhibits no distension. There is no tenderness. There is no guarding.  Musculoskeletal: Normal range of motion. He exhibits no edema or tenderness.  Lymphadenopathy:    He has no cervical adenopathy.  Neurological: He is alert and oriented to person, place, and time.  Skin: Skin is warm and dry. No rash noted. He is not diaphoretic. No erythema.  Psychiatric: His behavior is normal. Judgment normal.    Assessment/Plan: Symptomatic cholelithiasis with common bile duct stone  I discusses with patient and his wife. Laparoscopic cholecystectomy is now recommended. I discussed the reasoning for this with him in detail. He is doing very well after the ERCP. I have made him npo at midnight tonight for possible surgery tomorrow. I did briefly discussed procedure with him. Timing will depend on Dr. Josetta Huddle OR schedule tomorrow.  Moody Robben A 05/27/2016, 10:34 PM

## 2016-05-27 NOTE — Progress Notes (Addendum)
Patient ID: Brad Ramirez, male   DOB: Dec 02, 1980, 35 y.o.   MRN: [id number]    PROGRESS NOTE    Brad Ramirez  WUJ:[id number] DOB: 1981/02/28 DOA: 05/25/2016  PCP: Colette Ribas, MD   Brief Narrative:  35 y.o. male with medical history significant for opiate abuse now in remission who presents to the emergency department with approximately 10 days of right upper quadrant pain with nausea and vomiting. Patient reports that he had been in his usual state of health until the insidious development of a mild discomfort in the epigastrium and right upper quadrant approximately 10 days ago. He noted that it was worse with eating and initially suspected this was related to his GERD.   Assessment & Plan:   Principal Problem:   Biliary obstruction, intra and extra hepatic, transaminitis - secondary to gallstone per  MRCP - plan for ERCP today, keep NPO - continue to provide analgesia as needed - CMET In AM  Active Problems:   Leukocytosis - secondary to the above - repeat CBC in AM   DVT prophylaxis: Lovenox SQ Code Status: Full Family Communication: Patient and significant other at bedside  Disposition Plan: Home when GI team clears   Consultants:   GI  Procedures:   ERCP planned for today 05/27/2016  Antimicrobials:   None  Subjective: No events overnight.   Objective: Vitals:   05/26/16 1445 05/26/16 1843 05/26/16 2140 05/27/16 0631  BP: (!) 91/46 124/66 (!) 105/49 (!) 95/52  Pulse: (!) 51  67 (!) 58  Resp: 17  16 16   Temp: 98.2 F (36.8 C)  98.2 F (36.8 C) 98 F (36.7 C)  TempSrc: Oral  Oral Oral  SpO2: 100% 100% 100% 99%  Weight:      Height:        Intake/Output Summary (Last 24 hours) at 05/27/16 1120 Last data filed at 05/27/16 9562  Gross per 24 hour  Intake          1760.83 ml  Output             1700 ml  Net            60.83 ml   Filed Weights   05/25/16 2047 05/26/16 0652  Weight: 93 kg (205 lb) 94.9 kg (209 lb 3.2 oz)     Examination:  General exam: Appears calm and comfortable  Respiratory system: Clear to auscultation. Respiratory effort normal. Cardiovascular system: S1 & S2 heard, RRR. No JVD, murmurs, rubs, gallops or clicks. No pedal edema. Gastrointestinal system: Abdomen is nondistended, soft and nontender. No organomegaly or masses felt. Normal bowel sounds heard. Central nervous system: Alert and oriented. No focal neurological deficits. Extremities: Symmetric 5 x 5 power  Data Reviewed: I have personally reviewed following labs and imaging studies  CBC:  Recent Labs Lab 05/25/16 2310  WBC 11.7*  NEUTROABS 6.4  HGB 14.2  HCT 41.9  MCV 86.0  PLT 310   Basic Metabolic Panel:  Recent Labs Lab 05/25/16 2310  NA 137  K 3.6  CL 104  CO2 25  GLUCOSE 102*  BUN 15  CREATININE 0.81  CALCIUM 9.4   Liver Function Tests:  Recent Labs Lab 05/25/16 2310  AST 235*  ALT 499*  ALKPHOS 250*  BILITOT 8.3*  PROT 7.5  ALBUMIN 4.1    Recent Labs Lab 05/25/16 2310  LIPASE 40   CBG:  Recent Labs Lab 05/26/16 0729 05/27/16 0746  GLUCAP 116* 104*   Urine analysis:  Component Value Date/Time   COLORURINE YELLOW 05/25/2016 2058   APPEARANCEUR CLEAR 05/25/2016 2058   LABSPEC 1.010 05/25/2016 2058   PHURINE 7.0 05/25/2016 2058   GLUCOSEU NEGATIVE 05/25/2016 2058   HGBUR NEGATIVE 05/25/2016 2058   BILIRUBINUR LARGE (A) 05/25/2016 2058   KETONESUR NEGATIVE 05/25/2016 2058   PROTEINUR NEGATIVE 05/25/2016 2058   NITRITE NEGATIVE 05/25/2016 2058   LEUKOCYTESUR NEGATIVE 05/25/2016 2058   Recent Results (from the past 240 hour(s))  Surgical pcr screen     Status: None   Collection Time: 05/26/16  8:33 PM  Result Value Ref Range Status   MRSA, PCR NEGATIVE NEGATIVE Final   Staphylococcus aureus NEGATIVE NEGATIVE Final    Radiology Studies: Ct Abdomen Pelvis W Contrast  Result Date: 05/26/2016 CLINICAL DATA:  Right flank pain, right upper quadrant pain with nausea and  vomiting for 2 weeks. Dark colored urine and difficulty urinating. EXAM: CT ABDOMEN AND PELVIS WITH CONTRAST TECHNIQUE: Multidetector CT imaging of the abdomen and pelvis was performed using the standard protocol following bolus administration of intravenous contrast. CONTRAST:  100mL ISOVUE-300 IOPAMIDOL (ISOVUE-300) INJECTION 61% COMPARISON:  11/28/2014 CT. FINDINGS: Lower chest: Respiratory motion limits assessment. No pneumonic consolidation, effusion or pneumothorax. Normal sized cardiac chambers to the extent visible. No pericardial effusion. Hepatobiliary: There is intra and extrahepatic biliary dilatation which is a new finding since prior exam. The common bile duct is distended measuring up to 11 mm in caliber distal and projects into the lumen of the second portion of the duodenum. Differential possibilities may include a stricture at the ampulla or potentially a choledochal cyst variant. No definite obstructing calculus. The gallbladder is not distended. Pancreas: No pancreatic ductal dilatation or definite mass. Spleen: Small splenule is seen near the splenic hilum. There is no splenomegaly. Adrenals/Urinary Tract: Normal adrenal glands. No obstructive uropathy of the kidneys. No enhancing renal mass. No nephrolithiasis. Urinary bladder is distended without calculus nor wall thickening. Stomach/Bowel: No bowel obstruction or acute inflammation. Normal-appearing appendix. Normal bowel rotation. Vascular/Lymphatic: No significant vascular findings are present. No enlarged abdominal or pelvic lymph nodes. Reproductive: Prostate is unremarkable. Other: No abdominal wall hernia or abnormality. No abdominopelvic ascites. Musculoskeletal: No acute or significant osseous findings. IMPRESSION: Dilated intra and extrahepatic bile ducts with slight protrusion of the distal common bile duct into the second portion of the duodenum. Differential possibilities may include stenosis at the ampulla or possibly a  choledochal cyst variant though this finding was not present in 2016 (Ser. 4, Image 36). No choledocholithiasis is apparent. No focal mass is identified. Electronically Signed   By: Tollie Ethavid  Kwon M.D.   On: 05/26/2016 01:18   Mr 3d Recon At Scanner  Result Date: 05/26/2016 CLINICAL DATA:  Abdominal pain, jaundice and abnormal CT scan. EXAM: MRI ABDOMEN WITHOUT AND WITH CONTRAST (INCLUDING MRCP) TECHNIQUE: Multiplanar multisequence MR imaging of the abdomen was performed both before and after the administration of intravenous contrast. Heavily T2-weighted images of the biliary and pancreatic ducts were obtained, and three-dimensional MRCP images were rendered by post processing. CONTRAST:  20mL MULTIHANCE GADOBENATE DIMEGLUMINE 529 MG/ML IV SOLN COMPARISON:  CT scan 05/26/2016 and prior CT scan 11/28/2014 FINDINGS: Lower chest: The lung bases are grossly clear. No infiltrates or effusion. No pericardial effusion. Hepatobiliary: No focal hepatic lesions are identified. There is intrahepatic biliary dilatation as demonstrated on the recent CT scan. There is also moderate common bile duct dilatation measuring a maximum of 10 mm. The common bile duct protrudes into the duodenum but I  do not see any evidence of a ampullary mass or pancreatic mass. The cholangiogram images are somewhat limited by motion but findings are suspicious for small distal common bile duct stone best seen on series 6, image 26 and also on series 12, image number 4. Numerous small layering gallstones are noted in the gallbladder. Pancreas:  No mass, inflammation or ductal dilatation. Spleen:  Normal size.  No focal lesions. Adrenals/Urinary Tract:  The adrenal glands and kidneys are normal. Stomach/Bowel: The stomach, duodenum, visualized small bowel and visualize colon are grossly normal. Vascular/Lymphatic: No pathologically enlarged lymph nodes identified. No abdominal aortic aneurysm demonstrated. Other:  No ascites or abdominal wall hernia.  Musculoskeletal: No significant bony findings. IMPRESSION: 1. Intra and extrahepatic biliary dilatation likely due to a small focal distal common bile duct stone. 2. Numerous small layering gallstones in gallbladder. 3. Normal pancreatic duct and no pancreatic head mass or ampullary lesion. Electronically Signed   By: Rudie Meyer M.D.   On: 05/26/2016 19:42   Mr Abdomen Mrcp Vivien Rossetti Contast  Result Date: 05/26/2016 CLINICAL DATA:  Abdominal pain, jaundice and abnormal CT scan. EXAM: MRI ABDOMEN WITHOUT AND WITH CONTRAST (INCLUDING MRCP) TECHNIQUE: Multiplanar multisequence MR imaging of the abdomen was performed both before and after the administration of intravenous contrast. Heavily T2-weighted images of the biliary and pancreatic ducts were obtained, and three-dimensional MRCP images were rendered by post processing. CONTRAST:  20mL MULTIHANCE GADOBENATE DIMEGLUMINE 529 MG/ML IV SOLN COMPARISON:  CT scan 05/26/2016 and prior CT scan 11/28/2014 FINDINGS: Lower chest: The lung bases are grossly clear. No infiltrates or effusion. No pericardial effusion. Hepatobiliary: No focal hepatic lesions are identified. There is intrahepatic biliary dilatation as demonstrated on the recent CT scan. There is also moderate common bile duct dilatation measuring a maximum of 10 mm. The common bile duct protrudes into the duodenum but I do not see any evidence of a ampullary mass or pancreatic mass. The cholangiogram images are somewhat limited by motion but findings are suspicious for small distal common bile duct stone best seen on series 6, image 26 and also on series 12, image number 4. Numerous small layering gallstones are noted in the gallbladder. Pancreas:  No mass, inflammation or ductal dilatation. Spleen:  Normal size.  No focal lesions. Adrenals/Urinary Tract:  The adrenal glands and kidneys are normal. Stomach/Bowel: The stomach, duodenum, visualized small bowel and visualize colon are grossly normal.  Vascular/Lymphatic: No pathologically enlarged lymph nodes identified. No abdominal aortic aneurysm demonstrated. Other:  No ascites or abdominal wall hernia. Musculoskeletal: No significant bony findings. IMPRESSION: 1. Intra and extrahepatic biliary dilatation likely due to a small focal distal common bile duct stone. 2. Numerous small layering gallstones in gallbladder. 3. Normal pancreatic duct and no pancreatic head mass or ampullary lesion. Electronically Signed   By: Rudie Meyer M.D.   On: 05/26/2016 19:42      Scheduled Meds: . enoxaparin (LOVENOX) injection  40 mg Subcutaneous Q24H  . feeding supplement (ENSURE ENLIVE)  237 mL Oral BID BM  . Influenza vac split quadrivalent PF  0.5 mL Intramuscular Tomorrow-1000  . pantoprazole  40 mg Oral Daily  . pneumococcal 23 valent vaccine  0.5 mL Intramuscular Tomorrow-1000  . QUEtiapine  50 mg Oral QHS   Continuous Infusions: . sodium chloride 50 mL/hr at 05/27/16 0829  . sodium chloride       LOS: 1 day   Time spent: 20 minutes   Debbora Presto, MD [facility] Pager [phone]  If  7PM-7AM, please contact night-coverage [email or web address] Password [email or web address] 05/27/2016, 11:20 AM

## 2016-05-27 NOTE — Transfer of Care (Signed)
Immediate Anesthesia Transfer of Care Note  Patient: Brad Ramirez  Procedure(s) Performed: Procedure(s): ENDOSCOPIC RETROGRADE CHOLANGIOPANCREATOGRAPHY (ERCP) (N/A)  Patient Location: Endoscopy Unit  Anesthesia Type:General  Level of Consciousness: awake, alert , oriented and patient cooperative  Airway & Oxygen Therapy: Patient Spontanous Breathing and Patient connected to nasal cannula oxygen  Post-op Assessment: Report given to RN, Post -op Vital signs reviewed and stable and Patient moving all extremities X 4  Post vital signs: Reviewed and stable  Last Vitals:  Vitals:   05/27/16 1150 05/27/16 1337  BP: 105/60 130/62  Pulse: 61 84  Resp: 20 20  Temp: 37 C 36.6 C    Last Pain:  Vitals:   05/27/16 1337  TempSrc: Oral  PainSc:       Patients Stated Pain Goal: 4 (05/26/16 0339)  Complications: No apparent anesthesia complications

## 2016-05-27 NOTE — Progress Notes (Signed)
Nutrition Brief Note  Patient identified on the Malnutrition Screening Tool (MST) Report  Wt Readings from Last 15 Encounters:  05/26/16 209 lb 3.2 oz (94.9 kg)  11/28/14 230 lb (104.3 kg)  11/28/14 108 lb (49 kg)   Brad BihariJacob R Pat is a 35 y.o. male with medical history significant for opiate abuse now in remission who presents to the emergency department with approximately 10 days of right upper quadrant pain with nausea and vomiting. Patient reports that he had been in his usual state of health until the insidious development of a mild discomfort in the epigastrium and right upper quadrant approximately 10 days ago. He noted that it was worse with eating and initially suspected this was related to his GERD.  Spoke with pt and spouse at bedside. They report pt with decreased appetite over 1 month, due to abdominal pain. Per pt, he reveals that he does not have the desire to eat. Pt typicaly consumes 2 meals per day (Breakfast: coffee, Lunch: "varous things", Dinner: meat, starch, and vegetable). Pt also admits to snacking between meals.  Pt spouse estimates a 6-7# wt loss over the past month. UBE around 205# per pt report.   Nutrition-Focused physical exam completed. Findings are no fat depletion, no muscle depletion, and no edema.   Pt NPO for ERCP today. He reports he tolerated dinner well last night.   Body mass index is 26.86 kg/m. Patient meets criteria for overweight based on current BMI.   Current diet order is NPO (previously soft diet), patient is consuming approximately 75-100% of meals at this time. Labs and medications reviewed.   No nutrition interventions warranted at this time. If nutrition issues arise, please consult RD.   Nathanyel Defenbaugh A. Mayford KnifeWilliams, RD, LDN, CDE Pager: [phone] After hours Pager: [phone]

## 2016-05-27 NOTE — Anesthesia Postprocedure Evaluation (Signed)
Anesthesia Post Note  Patient: Rafael BihariJacob R Icenhour  Procedure(s) Performed: Procedure(s) (LRB): ENDOSCOPIC RETROGRADE CHOLANGIOPANCREATOGRAPHY (ERCP) (N/A)  Patient location during evaluation: Endoscopy Anesthesia Type: General Level of consciousness: awake and alert Pain management: pain level controlled Vital Signs Assessment: post-procedure vital signs reviewed and stable Respiratory status: spontaneous breathing, nonlabored ventilation, respiratory function stable and patient connected to nasal cannula oxygen Cardiovascular status: blood pressure returned to baseline and stable Postop Assessment: no signs of nausea or vomiting Anesthetic complications: no    Last Vitals:  Vitals:   05/27/16 1347 05/27/16 1357  BP: 127/68 129/64  Pulse: 72 74  Resp: (!) 28 (!) 23  Temp:      Last Pain:  Vitals:   05/27/16 1337  TempSrc: Oral  PainSc:                  Kathryne Ramella,JAMES TERRILL

## 2016-05-27 NOTE — Anesthesia Preprocedure Evaluation (Signed)
Anesthesia Evaluation  Patient identified by MRN, date of birth, ID band Patient awake    Reviewed: Allergy & Precautions, NPO status , Patient's Chart, lab work & pertinent test results  Airway Mallampati: I  TM Distance: >3 FB Neck ROM: Full    Dental  (+) Teeth Intact, Dental Advisory Given   Pulmonary neg pulmonary ROS, Current Smoker,    breath sounds clear to auscultation       Cardiovascular negative cardio ROS   Rhythm:Regular Rate:Normal     Neuro/Psych negative neurological ROS  negative psych ROS   GI/Hepatic negative GI ROS, Neg liver ROS, GERD  ,  Endo/Other  negative endocrine ROS  Renal/GU negative Renal ROS  negative genitourinary   Musculoskeletal negative musculoskeletal ROS (+)   Abdominal   Peds negative pediatric ROS (+)  Hematology negative hematology ROS (+)   Anesthesia Other Findings   Reproductive/Obstetrics negative OB ROS                             Anesthesia Physical Anesthesia Plan  ASA: I  Anesthesia Plan: General   Post-op Pain Management:    Induction: Intravenous  Airway Management Planned: Oral ETT  Additional Equipment:   Intra-op Plan:   Post-operative Plan: Extubation in OR  Informed Consent: I have reviewed the patients History and Physical, chart, labs and discussed the procedure including the risks, benefits and alternatives for the proposed anesthesia with the patient or authorized representative who has indicated his/her understanding and acceptance.   Dental advisory given  Plan Discussed with: CRNA  Anesthesia Plan Comments:         Anesthesia Quick Evaluation

## 2016-05-27 NOTE — Anesthesia Procedure Notes (Signed)
Procedure Name: Intubation Date/Time: 05/27/2016 12:49 PM Performed by: Rogelia BogaMUELLER, Haywood Meinders P Pre-anesthesia Checklist: Emergency Drugs available, Suction available, Patient identified, Patient being monitored and Timeout performed Patient Re-evaluated:Patient Re-evaluated prior to inductionOxygen Delivery Method: Circle system utilized Preoxygenation: Pre-oxygenation with 100% oxygen Intubation Type: IV induction Ventilation: Mask ventilation without difficulty Laryngoscope Size: Mac and 4 Grade View: Grade I Tube type: Oral Tube size: 7.5 mm Number of attempts: 1 Airway Equipment and Method: Stylet Placement Confirmation: ETT inserted through vocal cords under direct vision,  positive ETCO2 and breath sounds checked- equal and bilateral Secured at: 24 cm Tube secured with: Tape Dental Injury: Teeth and Oropharynx as per pre-operative assessment

## 2016-05-27 NOTE — Progress Notes (Signed)
ERCP done. See note. Needs surgical consult for cholecystectomy

## 2016-05-27 NOTE — Op Note (Signed)
[facility] Name: Brad HughJacob Bezek Procedure Date : 05/27/2016 MRN: [id number] Attending MD: Barrie FolkJohn C Biviana Saddler , MD Date of Birth: 10/24/1980 CSN: [id number] Age: 35 Admit Type: Inpatient Procedure:                ERCP Indications:              Suspected bile duct stone(s) Providers:                Everardo AllJohn C. Madilyn FiremanHayes, MD, Dwain SarnaPatricia Ford, RN, Arlee Muslimhris                            Chandler Tech., Technician, Kirt Boyshomas Mueller, CRNA Referring MD:              Medicines:                General Anesthesia Complications:            No immediate complications. Estimated Blood Loss:     Estimated blood loss: none. Procedure:                Pre-Anesthesia Assessment:                           - Prior to the procedure, a History and Physical                            was performed, and patient medications and                            allergies were reviewed. The patient's tolerance of                            previous anesthesia was also reviewed. The risks                            and benefits of the procedure and the sedation                            options and risks were discussed with the patient.                            All questions were answered, and informed consent                            was obtained. Prior Anticoagulants: The patient has                            taken no previous anticoagulant or antiplatelet                            agents. ASA Grade Assessment: I - A normal, healthy                            patient. After reviewing the risks and benefits,  the patient was deemed in satisfactory condition to                            undergo the procedure.                           After obtaining informed consent, the scope was                            passed under direct vision. Throughout the                            procedure, the patient's blood pressure, pulse, and                            oxygen saturations were  monitored continuously. The                            [id number]) scope was introduced through                            the mouth, and used to inject contrast into and                            used to inject contrast into the bile duct. The                            ERCP was accomplished without difficulty. The                            patient tolerated the procedure well. Scope In: Scope Out: Findings:      The major papilla was bulging. The major papilla contained a stone. The       bile duct was deeply cannulated with the traction (standard)       sphincterotome. Contrast was injected. I personally interpreted the bile       duct images. Ductal flow of contrast was adequate. Image quality was       excellent. Contrast extended to the main bile duct. The lower third of       the main bile duct contained one stone, which was 4 mm in diameter. The       main bile duct was moderately dilated, acquired. An 8 mm biliary       sphincterotomy was made with a traction (standard) sphincterotome using       ERBE electrocautery. The sphincterotomy oozed blood. To discover       objects, the biliary tree was swept with a 15 mm balloon starting at the       upper third of the main bile duct. One stone was removed. No stones       remained. Impression:               - The major papilla appeared to be bulging.                           - The entire main bile duct was moderately dilated,  acquired.                           - Choledocholithiasis was found. Complete removal                            was accomplished by biliary sphincterotomy and                            balloon extraction.                           - A biliary sphincterotomy was performed.                           - The biliary tree was swept. Recommendation:           - Advance diet as tolerated.                           - Check liver enzymes (AST, ALT, alkaline                             phosphatase, bilirubin) tomorrow. Procedure Code(s):        --- Professional ---                           [phone], Endoscopic retrograde                            cholangiopancreatography (ERCP); with removal of                            calculi/debris from biliary/pancreatic duct(s)                           43262, Endoscopic retrograde                            cholangiopancreatography (ERCP); with                            sphincterotomy/papillotomy Diagnosis Code(s):        --- Professional ---                           K83.9, Disease of biliary tract, unspecified                           K80.50, Calculus of bile duct without cholangitis                            or cholecystitis without obstruction                           K83.8, Other specified diseases of biliary tract CPT copyright 2016 [employer]. All rights reserved. The codes documented in this report are preliminary and upon coder review may  be revised to meet current compliance  requirements. Barrie FolkJohn C Chani Ghanem, MD 05/27/2016 1:27:30 PM This report has been signed electronically. Number of Addenda: 0

## 2016-05-28 ENCOUNTER — Encounter (HOSPITAL_COMMUNITY)
Admission: EM | Disposition: A | Discharge status: Home / Self Care | Payer: Self-pay | Source: Home / Self Care | Attending: Internal Medicine

## 2016-05-28 ENCOUNTER — Encounter (HOSPITAL_COMMUNITY): Payer: Self-pay | Admitting: Gastroenterology

## 2016-05-28 ENCOUNTER — Inpatient Hospital Stay (HOSPITAL_COMMUNITY): Payer: BLUE CROSS/BLUE SHIELD | Admitting: Certified Registered Nurse Anesthetist

## 2016-05-28 HISTORY — PX: CHOLECYSTECTOMY: SHX55

## 2016-05-28 LAB — COMPREHENSIVE METABOLIC PANEL
ALBUMIN: 3 g/dL — AB (ref 3.5–5.0)
ALT: 298 U/L — ABNORMAL HIGH (ref 17–63)
ALT: 301 U/L — ABNORMAL HIGH (ref 17–63)
ANION GAP: 6 (ref 5–15)
ANION GAP: 4 — AB (ref 5–15)
AST: 108 U/L — ABNORMAL HIGH (ref 15–41)
AST: 108 U/L — ABNORMAL HIGH (ref 15–41)
Albumin: 3 g/dL — ABNORMAL LOW (ref 3.5–5.0)
Alkaline Phosphatase: 179 U/L — ABNORMAL HIGH (ref 38–126)
Alkaline Phosphatase: 191 U/L — ABNORMAL HIGH (ref 38–126)
BUN: 13 mg/dL (ref 6–20)
BUN: 14 mg/dL (ref 6–20)
CALCIUM: 8.7 mg/dL — AB (ref 8.9–10.3)
CHLORIDE: 108 mmol/L (ref 101–111)
CO2: 26 mmol/L (ref 22–32)
CO2: 26 mmol/L (ref 22–32)
Calcium: 8.9 mg/dL (ref 8.9–10.3)
Chloride: 109 mmol/L (ref 101–111)
Creatinine, Ser: 0.91 mg/dL (ref 0.61–1.24)
Creatinine, Ser: 0.94 mg/dL (ref 0.61–1.24)
GFR calc non Af Amer: >60 mL/min (ref >60)
GFR calc non Af Amer: >60 mL/min (ref >60)
GLUCOSE: 131 mg/dL — AB (ref 65–99)
Glucose, Bld: 113 mg/dL — ABNORMAL HIGH (ref 65–99)
POTASSIUM: 4 mmol/L (ref 3.5–5.1)
POTASSIUM: 5.1 mmol/L (ref 3.5–5.1)
SODIUM: 141 mmol/L (ref 135–145)
SODIUM: 138 mmol/L (ref 135–145)
TOTAL PROTEIN: 5.8 g/dL — AB (ref 6.5–8.1)
Total Bilirubin: 3.4 mg/dL — ABNORMAL HIGH (ref 0.3–1.2)
Total Bilirubin: 3.4 mg/dL — ABNORMAL HIGH (ref 0.3–1.2)
Total Protein: 5.9 g/dL — ABNORMAL LOW (ref 6.5–8.1)

## 2016-05-28 LAB — CBC
HEMATOCRIT: 34.2 % — AB (ref 39.0–52.0)
HEMOGLOBIN: 11.1 g/dL — AB (ref 13.0–17.0)
MCH: 28.2 pg (ref 26.0–34.0)
MCHC: 32.5 g/dL (ref 30.0–36.0)
MCV: 87 fL (ref 78.0–100.0)
Platelets: 221 10*3/uL (ref 150–400)
RBC: 3.93 MIL/uL — AB (ref 4.22–5.81)
RDW: 16 % — ABNORMAL HIGH (ref 11.5–15.5)
WBC: 9.4 10*3/uL (ref 4.0–10.5)

## 2016-05-28 SURGERY — LAPAROSCOPIC CHOLECYSTECTOMY
Anesthesia: General | Site: Abdomen

## 2016-05-28 MED ORDER — FENTANYL CITRATE (PF) 100 MCG/2ML IJ SOLN
INTRAMUSCULAR | Status: AC
  Filled 2016-05-28: qty 2

## 2016-05-28 MED ORDER — SUGAMMADEX SODIUM 200 MG/2ML IV SOLN
INTRAVENOUS | Status: DC | PRN
  Administered 2016-05-28: 200 mg via INTRAVENOUS

## 2016-05-28 MED ORDER — HYDROMORPHONE HCL 2 MG/ML IJ SOLN
INTRAMUSCULAR | Status: AC
  Filled 2016-05-28: qty 1

## 2016-05-28 MED ORDER — 0.9 % SODIUM CHLORIDE (POUR BTL) OPTIME
TOPICAL | Status: DC | PRN
  Administered 2016-05-28: 1000 mL

## 2016-05-28 MED ORDER — CEFAZOLIN SODIUM-DEXTROSE 2-4 GM/100ML-% IV SOLN
2.0000 g | INTRAVENOUS | Status: AC
  Administered 2016-05-28: 2 g via INTRAVENOUS
  Filled 2016-05-28: qty 100

## 2016-05-28 MED ORDER — LACTATED RINGERS IV SOLN
INTRAVENOUS | Status: DC | PRN
  Administered 2016-05-28 (×2): via INTRAVENOUS

## 2016-05-28 MED ORDER — SODIUM CHLORIDE 0.9 % IR SOLN
Status: DC | PRN
  Administered 2016-05-28: 1000 mL

## 2016-05-28 MED ORDER — MIDAZOLAM HCL 2 MG/2ML IJ SOLN
INTRAMUSCULAR | Status: AC
  Filled 2016-05-28: qty 2

## 2016-05-28 MED ORDER — OXYCODONE-ACETAMINOPHEN 5-325 MG PO TABS
ORAL_TABLET | ORAL | Status: AC
Start: 2016-05-28 — End: 2016-05-29
  Filled 2016-05-28: qty 2

## 2016-05-28 MED ORDER — MIDAZOLAM HCL 5 MG/5ML IJ SOLN
INTRAMUSCULAR | Status: DC | PRN
  Administered 2016-05-28: 2 mg via INTRAVENOUS

## 2016-05-28 MED ORDER — PROPOFOL 10 MG/ML IV BOLUS
INTRAVENOUS | Status: AC
  Filled 2016-05-28: qty 20

## 2016-05-28 MED ORDER — IOPAMIDOL (ISOVUE-300) INJECTION 61%
INTRAVENOUS | Status: AC
  Filled 2016-05-28: qty 50

## 2016-05-28 MED ORDER — LIDOCAINE HCL (CARDIAC) 20 MG/ML IV SOLN
INTRAVENOUS | Status: DC | PRN
  Administered 2016-05-28: 60 mg via INTRAVENOUS

## 2016-05-28 MED ORDER — FENTANYL CITRATE (PF) 100 MCG/2ML IJ SOLN
INTRAMUSCULAR | Status: DC | PRN
  Administered 2016-05-28 (×6): 50 ug via INTRAVENOUS

## 2016-05-28 MED ORDER — ONDANSETRON HCL 4 MG/2ML IJ SOLN
4.0000 mg | Freq: Once | INTRAMUSCULAR | Status: DC | PRN
Start: 2016-05-28 — End: 2016-05-28

## 2016-05-28 MED ORDER — ONDANSETRON HCL 4 MG/2ML IJ SOLN
INTRAMUSCULAR | Status: DC | PRN
  Administered 2016-05-28: 4 mg via INTRAVENOUS

## 2016-05-28 MED ORDER — DEXTROSE 5 % IV SOLN
2.0000 g | INTRAVENOUS | Status: AC
  Filled 2016-05-28: qty 2

## 2016-05-28 MED ORDER — ROCURONIUM BROMIDE 100 MG/10ML IV SOLN
INTRAVENOUS | Status: DC | PRN
  Administered 2016-05-28: 50 mg via INTRAVENOUS
  Administered 2016-05-28: 10 mg via INTRAVENOUS

## 2016-05-28 MED ORDER — PROPOFOL 10 MG/ML IV BOLUS
INTRAVENOUS | Status: DC | PRN
  Administered 2016-05-28: 200 mg via INTRAVENOUS

## 2016-05-28 MED ORDER — BUPIVACAINE HCL 0.25 % IJ SOLN
INTRAMUSCULAR | Status: DC | PRN
  Administered 2016-05-28: 4 mL

## 2016-05-28 MED ORDER — OXYCODONE-ACETAMINOPHEN 5-325 MG PO TABS
1.0000 | ORAL_TABLET | ORAL | Status: DC | PRN
  Administered 2016-05-28 (×2): 2 via ORAL
  Administered 2016-05-29: 1 via ORAL
  Administered 2016-05-29: 2 via ORAL
  Filled 2016-05-28 (×3): qty 2

## 2016-05-28 MED ORDER — OXYCODONE HCL 5 MG PO TABS: 5.0000 mg | ORAL_TABLET | Freq: Once | ORAL | Status: DC | PRN

## 2016-05-28 MED ORDER — OXYCODONE HCL 5 MG/5ML PO SOLN: 5.0000 mg | Freq: Once | ORAL | Status: DC | PRN

## 2016-05-28 MED ORDER — SUCCINYLCHOLINE CHLORIDE 200 MG/10ML IV SOSY
PREFILLED_SYRINGE | INTRAVENOUS | Status: AC
  Filled 2016-05-28: qty 10

## 2016-05-28 MED ORDER — HYDROMORPHONE HCL 1 MG/ML IJ SOLN
0.2500 mg | INTRAMUSCULAR | Status: DC | PRN
  Administered 2016-05-28 (×4): 0.5 mg via INTRAVENOUS

## 2016-05-28 MED ORDER — ONDANSETRON HCL 4 MG/2ML IJ SOLN
INTRAMUSCULAR | Status: AC
Start: 2016-05-28 — End: 2016-05-28
  Filled 2016-05-28: qty 2

## 2016-05-28 SURGICAL SUPPLY — 41 items
ADH SKN CLS APL DERMABOND .7 (GAUZE/BANDAGES/DRESSINGS) ×2
APPLIER CLIP ROT 10 11.4 M/L (STAPLE) ×3
APR CLP MED LRG 11.4X10 (STAPLE) ×2
BAG SPEC RTRVL LRG 6X4 10 (ENDOMECHANICALS) ×2
BLADE SURG ROTATE 9660 (MISCELLANEOUS) IMPLANT
CANISTER SUCTION 2500CC (MISCELLANEOUS) ×3 IMPLANT
CHLORAPREP W/TINT 26ML (MISCELLANEOUS) ×3 IMPLANT
CLIP APPLIE ROT 10 11.4 M/L (STAPLE) ×2 IMPLANT
COVER MAYO STAND STRL (DRAPES) ×3 IMPLANT
COVER SURGICAL LIGHT HANDLE (MISCELLANEOUS) ×3 IMPLANT
DERMABOND ADVANCED (GAUZE/BANDAGES/DRESSINGS) ×1
DERMABOND ADVANCED .7 DNX12 (GAUZE/BANDAGES/DRESSINGS) ×2 IMPLANT
DRAPE C-ARM 42X72 X-RAY (DRAPES) ×3 IMPLANT
DRAPE WARM FLUID 44X44 (DRAPE) ×1 IMPLANT
ELECT REM PT RETURN 9FT ADLT (ELECTROSURGICAL) ×3
ELECTRODE REM PT RTRN 9FT ADLT (ELECTROSURGICAL) ×2 IMPLANT
GLOVE BIO SURGEON STRL SZ8 (GLOVE) ×3 IMPLANT
GLOVE BIOGEL PI IND STRL 8 (GLOVE) ×2 IMPLANT
GLOVE BIOGEL PI INDICATOR 8 (GLOVE) ×1
GOWN STRL REUS W/ TWL LRG LVL3 (GOWN DISPOSABLE) ×4 IMPLANT
GOWN STRL REUS W/ TWL XL LVL3 (GOWN DISPOSABLE) ×2 IMPLANT
GOWN STRL REUS W/TWL LRG LVL3 (GOWN DISPOSABLE) ×6
GOWN STRL REUS W/TWL XL LVL3 (GOWN DISPOSABLE) ×3
KIT BASIN OR (CUSTOM PROCEDURE TRAY) ×3 IMPLANT
KIT ROOM TURNOVER OR (KITS) ×3 IMPLANT
NS IRRIG 1000ML POUR BTL (IV SOLUTION) ×3 IMPLANT
PAD ARMBOARD 7.5X6 YLW CONV (MISCELLANEOUS) ×3 IMPLANT
POUCH SPECIMEN RETRIEVAL 10MM (ENDOMECHANICALS) ×3 IMPLANT
SCISSORS LAP 5X35 DISP (ENDOMECHANICALS) ×3 IMPLANT
SET CHOLANGIOGRAPH 5 50 .035 (SET/KITS/TRAYS/PACK) ×1 IMPLANT
SET IRRIG TUBING LAPAROSCOPIC (IRRIGATION / IRRIGATOR) ×3 IMPLANT
SLEEVE ENDOPATH XCEL 5M (ENDOMECHANICALS) ×3 IMPLANT
SPECIMEN JAR SMALL (MISCELLANEOUS) ×3 IMPLANT
SUT MNCRL AB 4-0 PS2 18 (SUTURE) ×3 IMPLANT
TOWEL OR 17X24 6PK STRL BLUE (TOWEL DISPOSABLE) ×3 IMPLANT
TOWEL OR 17X26 10 PK STRL BLUE (TOWEL DISPOSABLE) ×3 IMPLANT
TRAY LAPAROSCOPIC MC (CUSTOM PROCEDURE TRAY) ×3 IMPLANT
TROCAR XCEL BLUNT TIP 100MML (ENDOMECHANICALS) ×3 IMPLANT
TROCAR XCEL NON-BLD 11X100MML (ENDOMECHANICALS) ×3 IMPLANT
TROCAR XCEL NON-BLD 5MMX100MML (ENDOMECHANICALS) ×3 IMPLANT
TUBING INSUFFLATION (TUBING) ×3 IMPLANT

## 2016-05-28 NOTE — Progress Notes (Signed)
Patient ID: Brad BihariJacob R Dickens, male   DOB: 04/10/1981, 35 y.o.   MRN: [id number]    PROGRESS NOTE  Brad Ramirez  MVH:[id number] DOB: 05/09/1981 DOA: 05/25/2016  PCP: Colette RibasGOLDING, JOHN CABOT, MD   Brief Narrative:  35 y.o. male with medical history significant for opiate abuse now in remission who presents to the emergency department with approximately 10 days of right upper quadrant pain with nausea and vomiting. Patient reports that he had been in his usual state of health until the insidious development of a mild discomfort in the epigastrium and right upper quadrant approximately 10 days ago. He noted that it was worse with eating and initially suspected this was related to his GERD.   Assessment & Plan:   Principal Problem:   Biliary obstruction, intra and extra hepatic, transaminitis, choledocholithiasis  - per ERCP, thhe entire main bile duct was moderately dilated,  - Complete removal was accomplished by biliary sphincterotomy and balloon extraction. - biliary sphincterotomy performed. - plan for lap chole today, appreciate GI and surgery team assistance - continue to monitor LFT's  Active Problems:   Leukocytosis - secondary to the above - resolved   DVT prophylaxis: Lovenox SQ Code Status: Full Family Communication: Patient and significant other at bedside  Disposition Plan: Home when GI team clears   Consultants:   GI  Surgery   Procedures:   ERCP 05/27/2016  Antimicrobials:   None  Subjective: No events overnight.   Objective: Vitals:   05/27/16 1502 05/27/16 2108 05/28/16 0145 05/28/16 0521  BP: (!) 107/59 (!) 104/52 (!) 107/52 (!) 98/56  Pulse: 62 60 72 64  Resp: 18 17 17 17   Temp: 99.2 F (37.3 C) 98.3 F (36.8 C) 98.6 F (37 C) 98.4 F (36.9 C)  TempSrc: Oral Oral Oral Oral  SpO2: 100% 98% 98% 100%  Weight:    99.2 kg (218 lb 11.2 oz)  Height:        Intake/Output Summary (Last 24 hours) at 05/28/16 0927 Last data filed at 05/28/16 0758  Gross per 24 hour  Intake          2438.33 ml  Output             1000 ml  Net          1438.33 ml   Filed Weights   05/25/16 2047 05/26/16 0652 05/28/16 0521  Weight: 93 kg (205 lb) 94.9 kg (209 lb 3.2 oz) 99.2 kg (218 lb 11.2 oz)    Examination:  General exam: Appears calm and comfortable  Respiratory system: Clear to auscultation. Respiratory effort normal. Cardiovascular system: S1 & S2 heard, RRR. No JVD, murmurs, rubs, gallops or clicks. No pedal edema. Gastrointestinal system: Abdomen is nondistended, soft and nontender. No organomegaly or masses felt. Normal bowel sounds heard. Central nervous system: Alert and oriented. No focal neurological deficits. Extremities: Symmetric 5 x 5 power  Data Reviewed: I have personally reviewed following labs and imaging studies  CBC:  Recent Labs Lab 05/25/16 2310 05/28/16 0406  WBC 11.7* 9.4  NEUTROABS 6.4  --   HGB 14.2 11.1*  HCT 41.9 34.2*  MCV 86.0 87.0  PLT 310 221   Basic Metabolic Panel:  Recent Labs Lab 05/25/16 2310 05/28/16 0406  NA 137 141  K 3.6 4.0  CL 104 109  CO2 25 26  GLUCOSE 102* 131*  BUN 15 13  CREATININE 0.81 0.91  CALCIUM 9.4 8.7*   Liver Function Tests:  Recent Labs Lab 05/25/16 2310  05/28/16 0406  AST 235* 108*  ALT 499* 298*  ALKPHOS 250* 179*  BILITOT 8.3* 3.4*  PROT 7.5 5.8*  ALBUMIN 4.1 3.0*    Recent Labs Lab 05/25/16 2310  LIPASE 40   CBG:  Recent Labs Lab 05/26/16 0729 05/27/16 0746  GLUCAP 116* 104*   Urine analysis:    Component Value Date/Time   COLORURINE YELLOW 05/25/2016 2058   APPEARANCEUR CLEAR 05/25/2016 2058   LABSPEC 1.010 05/25/2016 2058   PHURINE 7.0 05/25/2016 2058   GLUCOSEU NEGATIVE 05/25/2016 2058   HGBUR NEGATIVE 05/25/2016 2058   BILIRUBINUR LARGE (A) 05/25/2016 2058   KETONESUR NEGATIVE 05/25/2016 2058   PROTEINUR NEGATIVE 05/25/2016 2058   NITRITE NEGATIVE 05/25/2016 2058   LEUKOCYTESUR NEGATIVE 05/25/2016 2058   Recent Results  (from the past 240 hour(s))  Surgical pcr screen     Status: None   Collection Time: 05/26/16  8:33 PM  Result Value Ref Range Status   MRSA, PCR NEGATIVE NEGATIVE Final   Staphylococcus aureus NEGATIVE NEGATIVE Final    Radiology Studies: Mr 3d Recon At Scanner  Result Date: 05/26/2016 CLINICAL DATA:  Abdominal pain, jaundice and abnormal CT scan. EXAM: MRI ABDOMEN WITHOUT AND WITH CONTRAST (INCLUDING MRCP) TECHNIQUE: Multiplanar multisequence MR imaging of the abdomen was performed both before and after the administration of intravenous contrast. Heavily T2-weighted images of the biliary and pancreatic ducts were obtained, and three-dimensional MRCP images were rendered by post processing. CONTRAST:  20mL MULTIHANCE GADOBENATE DIMEGLUMINE 529 MG/ML IV SOLN COMPARISON:  CT scan 05/26/2016 and prior CT scan 11/28/2014 FINDINGS: Lower chest: The lung bases are grossly clear. No infiltrates or effusion. No pericardial effusion. Hepatobiliary: No focal hepatic lesions are identified. There is intrahepatic biliary dilatation as demonstrated on the recent CT scan. There is also moderate common bile duct dilatation measuring a maximum of 10 mm. The common bile duct protrudes into the duodenum but I do not see any evidence of a ampullary mass or pancreatic mass. The cholangiogram images are somewhat limited by motion but findings are suspicious for small distal common bile duct stone best seen on series 6, image 26 and also on series 12, image number 4. Numerous small layering gallstones are noted in the gallbladder. Pancreas:  No mass, inflammation or ductal dilatation. Spleen:  Normal size.  No focal lesions. Adrenals/Urinary Tract:  The adrenal glands and kidneys are normal. Stomach/Bowel: The stomach, duodenum, visualized small bowel and visualize colon are grossly normal. Vascular/Lymphatic: No pathologically enlarged lymph nodes identified. No abdominal aortic aneurysm demonstrated. Other:  No ascites or  abdominal wall hernia. Musculoskeletal: No significant bony findings. IMPRESSION: 1. Intra and extrahepatic biliary dilatation likely due to a small focal distal common bile duct stone. 2. Numerous small layering gallstones in gallbladder. 3. Normal pancreatic duct and no pancreatic head mass or ampullary lesion. Electronically Signed   By: Rudie Meyer M.D.   On: 05/26/2016 19:42   Dg Ercp Biliary & Pancreatic Ducts  Result Date: 05/27/2016 CLINICAL DATA:  Bile duct stone EXAM: ERCP TECHNIQUE: Multiple spot images obtained with the fluoroscopic device and submitted for interpretation post-procedure. FLUOROSCOPY TIME:  Fluoroscopy Time:  1 minutes and 25 second Radiation Exposure Index (if provided by the fluoroscopic device): Number of Acquired Spot Images: Four COMPARISON:  None. FINDINGS: Contrast fills the common bile duct. Balloon stone retrieval is documented on the images. IMPRESSION: See above. These images were submitted for radiologic interpretation only. Please see the procedural report for the amount of contrast and the  fluoroscopy time utilized. Electronically Signed   By: Jolaine ClickArthur  Hoss M.D.   On: 05/27/2016 13:57   Mr Abdomen Mrcp Vivien RossettiW Wo Contast  Result Date: 05/26/2016 CLINICAL DATA:  Abdominal pain, jaundice and abnormal CT scan. EXAM: MRI ABDOMEN WITHOUT AND WITH CONTRAST (INCLUDING MRCP) TECHNIQUE: Multiplanar multisequence MR imaging of the abdomen was performed both before and after the administration of intravenous contrast. Heavily T2-weighted images of the biliary and pancreatic ducts were obtained, and three-dimensional MRCP images were rendered by post processing. CONTRAST:  20mL MULTIHANCE GADOBENATE DIMEGLUMINE 529 MG/ML IV SOLN COMPARISON:  CT scan 05/26/2016 and prior CT scan 11/28/2014 FINDINGS: Lower chest: The lung bases are grossly clear. No infiltrates or effusion. No pericardial effusion. Hepatobiliary: No focal hepatic lesions are identified. There is intrahepatic biliary  dilatation as demonstrated on the recent CT scan. There is also moderate common bile duct dilatation measuring a maximum of 10 mm. The common bile duct protrudes into the duodenum but I do not see any evidence of a ampullary mass or pancreatic mass. The cholangiogram images are somewhat limited by motion but findings are suspicious for small distal common bile duct stone best seen on series 6, image 26 and also on series 12, image number 4. Numerous small layering gallstones are noted in the gallbladder. Pancreas:  No mass, inflammation or ductal dilatation. Spleen:  Normal size.  No focal lesions. Adrenals/Urinary Tract:  The adrenal glands and kidneys are normal. Stomach/Bowel: The stomach, duodenum, visualized small bowel and visualize colon are grossly normal. Vascular/Lymphatic: No pathologically enlarged lymph nodes identified. No abdominal aortic aneurysm demonstrated. Other:  No ascites or abdominal wall hernia. Musculoskeletal: No significant bony findings. IMPRESSION: 1. Intra and extrahepatic biliary dilatation likely due to a small focal distal common bile duct stone. 2. Numerous small layering gallstones in gallbladder. 3. Normal pancreatic duct and no pancreatic head mass or ampullary lesion. Electronically Signed   By: Rudie MeyerP.  Gallerani M.D.   On: 05/26/2016 19:42      Scheduled Meds: . enoxaparin (LOVENOX) injection  40 mg Subcutaneous Q24H  . feeding supplement (ENSURE ENLIVE)  237 mL Oral BID BM  . Influenza vac split quadrivalent PF  0.5 mL Intramuscular Tomorrow-1000  . nicotine  14 mg Transdermal Daily  . pantoprazole  40 mg Oral Daily  . pneumococcal 23 valent vaccine  0.5 mL Intramuscular Tomorrow-1000  . QUEtiapine  50 mg Oral QHS   Continuous Infusions: . sodium chloride 50 mL/hr at 05/28/16 0910     LOS: 2 days   Time spent: 20 minutes   Debbora PrestoMAGICK-Jeanie Mccard, MD [facility] Pager [phone]  If 7PM-7AM, please contact night-coverage [email or web address] Password  [email or web address] 05/28/2016, 9:27 AM

## 2016-05-28 NOTE — Anesthesia Procedure Notes (Signed)
Procedure Name: Intubation Date/Time: 05/28/2016 10:56 AM Performed by: Rogelia BogaMUELLER, Isaul Landi P Pre-anesthesia Checklist: Patient identified, Emergency Drugs available, Suction available, Patient being monitored and Timeout performed Patient Re-evaluated:Patient Re-evaluated prior to inductionOxygen Delivery Method: Circle system utilized Preoxygenation: Pre-oxygenation with 100% oxygen Intubation Type: IV induction Ventilation: Mask ventilation without difficulty and Oral airway inserted - appropriate to patient size Laryngoscope Size: Mac and 4 Grade View: Grade II Tube type: Oral Tube size: 7.5 mm Number of attempts: 1 Airway Equipment and Method: Stylet Placement Confirmation: ETT inserted through vocal cords under direct vision,  positive ETCO2 and breath sounds checked- equal and bilateral Secured at: 22 cm Tube secured with: Tape Dental Injury: Teeth and Oropharynx as per pre-operative assessment

## 2016-05-28 NOTE — Op Note (Signed)
Laparoscopic Cholecystectomy  Procedure Note  Indications: This patient presents with symptomatic gallbladder disease and will undergo laparoscopic cholecystectomy.the patient was admitted yesterday for ERCP due to common bile duct stone is clear. He presents today for laparoscopic cholecystectomy for acute on chronic cholecystitis and choledocholithiasis.The procedure has been discussed with the patient. Operative and non operative treatments have been discussed. Risks of surgery include bleeding, infection,  Common bile duct injury,  Injury to the stomach,liver, colon,small intestine, abdominal wall,  Diaphragm,  Major blood vessels,  And the need for an open procedure.  Other risks include worsening of medical problems, death,  DVT and pulmonary embolism, and cardiovascular events.   Medical options have also been discussed. The patient has been informed of long term expectations of surgery and non surgical options,  The patient agrees to proceed.    Pre-operative Diagnosis: Calculus of bile duct with other cholecystitis, without mention of obstruction  Post-operative Diagnosis: Same  Surgeon: Ignazio Kincaid A.   Assistants: OR   Anesthesia: General endotracheal anesthesia and Local anesthesia 0.25.% bupivacaine  ASA Class: 2  Procedure Details  The patient was seen again in the Holding Room. The risks, benefits, complications, treatment options, and expected outcomes were discussed with the patient. The possibilities of reaction to medication, pulmonary aspiration, perforation of viscus, bleeding, recurrent infection, finding a normal gallbladder, the need for additional procedures, failure to diagnose a condition, the possible need to convert to an open procedure, and creating a complication requiring transfusion or operation were discussed with the patient. The patient and/or family concurred with the proposed plan, giving informed consent. The site of surgery properly noted/marked. The  patient was taken to Operating Room, identified as Rafael BihariJacob R Pinon and the procedure verified as Laparoscopic Cholecystectomy with Intraoperative Cholangiograms. A Time Out was held and the above information confirmed.  Prior to the induction of general anesthesia, antibiotic prophylaxis was administered. General endotracheal anesthesia was then administered and tolerated well. After the induction, the abdomen was prepped in the usual sterile fashion. The patient was positioned in the supine position with the left arm comfortably tucked, along with some reverse Trendelenburg.  Local anesthetic agent was injected into the skin near the umbilicus and an incision made. The midline fascia was incised and the Hasson technique was used to introduce a 12 mm port under direct vision. It was secured with a figure of eight Vicryl suture placed in the usual fashion. Pneumoperitoneum was then created with CO2 and tolerated well without any adverse changes in the patient's vital signs. Additional trocars were introduced under direct vision with an 11 mm trocar in the epigastrium and 2 5 mm trocars in the right upper quadrant. All skin incisions were infiltrated with a local anesthetic agent before making the incision and placing the trocars.   The gallbladder was identified, the fundus grasped and retracted cephalad. Adhesions were lysed bluntly and with the electrocautery where indicated, taking care not to injure any adjacent organs or viscus. The infundibulum was grasped and retracted laterally, exposing the peritoneum overlying the triangle of Calot. This was then divided and exposed in a blunt fashion. The cystic duct was clearly identified and bluntly dissected circumferentially. The junctions of the gallbladder, cystic duct and common bile duct were clearly identified prior to the division of any linear structure.   The critical view was obtained. I can visualize the common bile duct and the cystic duct. Given  his previous ERCP  And sphincterotomy, I elected to forego cholangiogram due to his anatomy  being clear.  The cystic duct was then  ligated with surgical clips  on the patient side and  clipped on the gallbladder side and divided. The cystic artery was identified, dissected free, ligated with clips and divided as well. Posterior cystic artery clipped and divided.  The gallbladder was dissected from the liver bed in retrograde fashion with the electrocautery. The gallbladder was removed with an endocatch bag. The liver bed was irrigated and inspected. Hemostasis was achieved with the electrocautery. Copious irrigation was utilized and was repeatedly aspirated until clear all particulate matter. Hemostasis was achieved with no signs  Of bleeding or bile leakage.  Pneumoperitoneum was completely reduced after viewing removal of the trocars under direct vision. The wound was thoroughly irrigated and the fascia was then closed with a figure of eight suture; the skin was then closed with 4 O monocryl and a sterile dressing was applied.  Instrument, sponge, and needle counts were correct at closure and at the conclusion of the case.   Findings: Cholecystitis with Cholelithiasis  Estimated Blood Loss: less than 50 mL         Drains: none         Total IV Fluids: 800 mL         Specimens: Gallbladder           Complications: None; patient tolerated the procedure well.         Disposition: PACU - hemodynamically stable.         Condition: stable

## 2016-05-28 NOTE — Anesthesia Postprocedure Evaluation (Signed)
Anesthesia Post Note  Patient: Brad BihariJacob R Ramirez  Procedure(s) Performed: Procedure(s) (LRB): LAPAROSCOPIC CHOLECYSTECTOMY (N/A)  Patient location during evaluation: PACU Anesthesia Type: General Level of consciousness: awake, awake and alert and oriented Pain management: pain level controlled Vital Signs Assessment: post-procedure vital signs reviewed and stable Respiratory status: spontaneous breathing, nonlabored ventilation and respiratory function stable Cardiovascular status: blood pressure returned to baseline Anesthetic complications: no    Last Vitals:  Vitals:   05/28/16 1320 05/28/16 1359  BP: 126/80 117/64  Pulse: 68 72  Resp: 16 16  Temp:  36.9 C    Last Pain:  Vitals:   05/28/16 1359  TempSrc: Oral  PainSc:                  Sean Malinowski COKER

## 2016-05-28 NOTE — Anesthesia Preprocedure Evaluation (Addendum)
Anesthesia Evaluation  Patient identified by MRN, date of birth, ID band Patient awake    Reviewed: Allergy & Precautions, NPO status , Patient's Chart, lab work & pertinent test results  Airway Mallampati: II  TM Distance: >3 FB Neck ROM: Full    Dental  (+) Teeth Intact, Dental Advisory Given   Pulmonary Current Smoker,    breath sounds clear to auscultation       Cardiovascular  Rhythm:Regular Rate:Normal     Neuro/Psych    GI/Hepatic   Endo/Other    Renal/GU      Musculoskeletal   Abdominal   Peds  Hematology   Anesthesia Other Findings   Reproductive/Obstetrics                           Anesthesia Physical Anesthesia Plan  ASA: II  Anesthesia Plan: General   Post-op Pain Management:    Induction: Intravenous  Airway Management Planned: Oral ETT  Additional Equipment:   Intra-op Plan:   Post-operative Plan: Extubation in OR  Informed Consent: I have reviewed the patients History and Physical, chart, labs and discussed the procedure including the risks, benefits and alternatives for the proposed anesthesia with the patient or authorized representative who has indicated his/her understanding and acceptance.   Dental advisory given  Plan Discussed with: CRNA, Anesthesiologist and Surgeon  Anesthesia Plan Comments:        Anesthesia Quick Evaluation

## 2016-05-28 NOTE — Progress Notes (Signed)
1 Day Post-Op  Subjective: Still feels sore and tender. He discussed cholecystectomy with Dr. Magnus IvanBlackman last evening. We will plan to go forward with surgery this a.m.  Objective: Vital signs in last 24 hours: Temp:  [97.9 F (36.6 C)-99.2 F (37.3 C)] 98.4 F (36.9 C) (11/09 0521) Pulse Rate:  [60-84] 64 (11/09 0521) Resp:  [17-28] 17 (11/09 0521) BP: (98-130)/(52-68) 98/56 (11/09 0521) SpO2:  [98 %-100 %] 100 % (11/09 0521) Weight:  [99.2 kg (218 lb 11.2 oz)] 99.2 kg (218 lb 11.2 oz) (11/09 0521) Last BM Date: 05/24/16 MAXIMUM TEMPERATURE 99.2, vital signs stable LFTs improving CBC 9.4, H/H down some Intake/Output from previous day: 11/08 0701 - 11/09 0700 In: 2350 [I.V.:2350] Out: 1000 [Urine:1000] Intake/Output this shift: Total I/O In: 98.3 [I.V.:98.3] Out: -   General appearance: alert, cooperative and no distress GI: Sore, he is not distended, no pain on palpation.  Lab Results:   Recent Labs  05/25/16 2310 05/28/16 0406  WBC 11.7* 9.4  HGB 14.2 11.1*  HCT 41.9 34.2*  PLT 310 221    BMET  Recent Labs  05/25/16 2310 05/28/16 0406  NA 137 141  K 3.6 4.0  CL 104 109  CO2 25 26  GLUCOSE 102* 131*  BUN 15 13  CREATININE 0.81 0.91  CALCIUM 9.4 8.7*   PT/INR No results for input(s): LABPROT, INR in the last 72 hours.   Recent Labs Lab 05/25/16 2310 05/28/16 0406  AST 235* 108*  ALT 499* 298*  ALKPHOS 250* 179*  BILITOT 8.3* 3.4*  PROT 7.5 5.8*  ALBUMIN 4.1 3.0*     Lipase     Component Value Date/Time   LIPASE 40 05/25/2016 2310     Studies/Results: Mr 3d Recon At Scanner  Result Date: 05/26/2016 CLINICAL DATA:  Abdominal pain, jaundice and abnormal CT scan. EXAM: MRI ABDOMEN WITHOUT AND WITH CONTRAST (INCLUDING MRCP) TECHNIQUE: Multiplanar multisequence MR imaging of the abdomen was performed both before and after the administration of intravenous contrast. Heavily T2-weighted images of the biliary and pancreatic ducts were obtained,  and three-dimensional MRCP images were rendered by post processing. CONTRAST:  20mL MULTIHANCE GADOBENATE DIMEGLUMINE 529 MG/ML IV SOLN COMPARISON:  CT scan 05/26/2016 and prior CT scan 11/28/2014 FINDINGS: Lower chest: The lung bases are grossly clear. No infiltrates or effusion. No pericardial effusion. Hepatobiliary: No focal hepatic lesions are identified. There is intrahepatic biliary dilatation as demonstrated on the recent CT scan. There is also moderate common bile duct dilatation measuring a maximum of 10 mm. The common bile duct protrudes into the duodenum but I do not see any evidence of a ampullary mass or pancreatic mass. The cholangiogram images are somewhat limited by motion but findings are suspicious for small distal common bile duct stone best seen on series 6, image 26 and also on series 12, image number 4. Numerous small layering gallstones are noted in the gallbladder. Pancreas:  No mass, inflammation or ductal dilatation. Spleen:  Normal size.  No focal lesions. Adrenals/Urinary Tract:  The adrenal glands and kidneys are normal. Stomach/Bowel: The stomach, duodenum, visualized small bowel and visualize colon are grossly normal. Vascular/Lymphatic: No pathologically enlarged lymph nodes identified. No abdominal aortic aneurysm demonstrated. Other:  No ascites or abdominal wall hernia. Musculoskeletal: No significant bony findings. IMPRESSION: 1. Intra and extrahepatic biliary dilatation likely due to a small focal distal common bile duct stone. 2. Numerous small layering gallstones in gallbladder. 3. Normal pancreatic duct and no pancreatic head mass or ampullary lesion.  Electronically Signed   By: Rudie Meyer M.D.   On: 05/26/2016 19:42   Dg Ercp Biliary & Pancreatic Ducts  Result Date: 05/27/2016 CLINICAL DATA:  Bile duct stone EXAM: ERCP TECHNIQUE: Multiple spot images obtained with the fluoroscopic device and submitted for interpretation post-procedure. FLUOROSCOPY TIME:  Fluoroscopy  Time:  1 minutes and 25 second Radiation Exposure Index (if provided by the fluoroscopic device): Number of Acquired Spot Images: Four COMPARISON:  None. FINDINGS: Contrast fills the common bile duct. Balloon stone retrieval is documented on the images. IMPRESSION: See above. These images were submitted for radiologic interpretation only. Please see the procedural report for the amount of contrast and the fluoroscopy time utilized. Electronically Signed   By: Jolaine Click M.D.   On: 05/27/2016 13:57   Mr Abdomen Mrcp Vivien Rossetti Contast  Result Date: 05/26/2016 CLINICAL DATA:  Abdominal pain, jaundice and abnormal CT scan. EXAM: MRI ABDOMEN WITHOUT AND WITH CONTRAST (INCLUDING MRCP) TECHNIQUE: Multiplanar multisequence MR imaging of the abdomen was performed both before and after the administration of intravenous contrast. Heavily T2-weighted images of the biliary and pancreatic ducts were obtained, and three-dimensional MRCP images were rendered by post processing. CONTRAST:  20mL MULTIHANCE GADOBENATE DIMEGLUMINE 529 MG/ML IV SOLN COMPARISON:  CT scan 05/26/2016 and prior CT scan 11/28/2014 FINDINGS: Lower chest: The lung bases are grossly clear. No infiltrates or effusion. No pericardial effusion. Hepatobiliary: No focal hepatic lesions are identified. There is intrahepatic biliary dilatation as demonstrated on the recent CT scan. There is also moderate common bile duct dilatation measuring a maximum of 10 mm. The common bile duct protrudes into the duodenum but I do not see any evidence of a ampullary mass or pancreatic mass. The cholangiogram images are somewhat limited by motion but findings are suspicious for small distal common bile duct stone best seen on series 6, image 26 and also on series 12, image number 4. Numerous small layering gallstones are noted in the gallbladder. Pancreas:  No mass, inflammation or ductal dilatation. Spleen:  Normal size.  No focal lesions. Adrenals/Urinary Tract:  The adrenal  glands and kidneys are normal. Stomach/Bowel: The stomach, duodenum, visualized small bowel and visualize colon are grossly normal. Vascular/Lymphatic: No pathologically enlarged lymph nodes identified. No abdominal aortic aneurysm demonstrated. Other:  No ascites or abdominal wall hernia. Musculoskeletal: No significant bony findings. IMPRESSION: 1. Intra and extrahepatic biliary dilatation likely due to a small focal distal common bile duct stone. 2. Numerous small layering gallstones in gallbladder. 3. Normal pancreatic duct and no pancreatic head mass or ampullary lesion. Electronically Signed   By: Rudie Meyer M.D.   On: 05/26/2016 19:42   Prior to Admission medications   Medication Sig Start Date End Date Taking? Authorizing Provider  naproxen sodium (ANAPROX) 220 MG tablet Take 440-660 mg by mouth 2 (two) times daily as needed (for pain).   Yes Historical Provider, MD  omeprazole (PRILOSEC) 20 MG capsule Take 20 mg by mouth daily.   Yes Historical Provider, MD    Medications: . enoxaparin (LOVENOX) injection  40 mg Subcutaneous Q24H  . feeding supplement (ENSURE ENLIVE)  237 mL Oral BID BM  . Influenza vac split quadrivalent PF  0.5 mL Intramuscular Tomorrow-1000  . nicotine  14 mg Transdermal Daily  . pantoprazole  40 mg Oral Daily  . pneumococcal 23 valent vaccine  0.5 mL Intramuscular Tomorrow-1000  . QUEtiapine  50 mg Oral QHS   . sodium chloride 50 mL/hr at 05/28/16 0910    Assessment/Plan  Symptomatic cholelithiasis with common bile duct stone S/p ERCP, 05/27/16 Dr. Dorena CookeyJohn Hayes FEN:  IV fluids/NPO ID:  Cipro one dose yesterday, Rocephin this AM DVT:  Lovenox  Plan: Laparoscopic cholecystectomy with IOC today. Risk and benefits were discussed with he and his wife. Repeat labs ordered for a.m.      LOS: 2 days    Braelynne Garinger 05/28/2016 [phone]

## 2016-05-28 NOTE — Interval H&P Note (Signed)
History and Physical Interval Note:  05/28/2016 10:15 AM  Brad BihariJacob R Ramirez  has presented today for surgery, with the diagnosis of Choledocholithiasis  The various methods of treatment have been discussed with the patient and family. After consideration of risks, benefits and other options for treatment, the patient has consented to  Procedure(s): LAPAROSCOPIC CHOLECYSTECTOMY WITH INTRAOPERATIVE CHOLANGIOGRAM (N/A) as a surgical intervention .  The patient's history has been reviewed, patient examined, no change in status, stable for surgery.  I have reviewed the patient's chart and labs.  Questions were answered to the patient's satisfaction.     Cato Liburd A.

## 2016-05-28 NOTE — Transfer of Care (Signed)
Immediate Anesthesia Transfer of Care Note  Patient: Brad Ramirez  Procedure(s) Performed: Procedure(s): LAPAROSCOPIC CHOLECYSTECTOMY (N/A)  Patient Location: PACU  Anesthesia Type:General  Level of Consciousness: awake, alert , oriented and patient cooperative  Airway & Oxygen Therapy: Patient Spontanous Breathing and Patient connected to nasal cannula oxygen  Post-op Assessment: Report given to RN, Post -op Vital signs reviewed and stable and Patient moving all extremities X 4  Post vital signs: Reviewed and stable  Last Vitals:  Vitals:   05/28/16 0145 05/28/16 0521  BP: (!) 107/52 (!) 98/56  Pulse: 72 64  Resp: 17 17  Temp: 37 C 36.9 C    Last Pain:  Vitals:   05/28/16 0911  TempSrc:   PainSc: 8       Patients Stated Pain Goal: 4 (05/26/16 0339)  Complications: No apparent anesthesia complications

## 2016-05-28 NOTE — H&P (View-Only) (Signed)
Reason for Consult:symtomatic cholelithiasis Referring Physician: Dr. Teena Irani  Brad Ramirez is an 35 y.o. male.  HPI: This is a pleasant gentleman who is transferred down from [facility] yesterday for choledocholithiasis. He originally presented with a ten-day history of abdominal pain. He was found to be jaundiced. He was found to have elevated bilirubin. He underwent an ERCP today by Dr. Amedeo Plenty and a common bile duct stone  was removed.  Currently, the patient feels much better and has almost no abdominal pain. His wife reports that his jaundice has significantly improved. He is already walking in the hallways. He may have had some mild gallbladder attacks in the past. He is otherwise healthy without complaints.  Past Medical History:  Diagnosis Date  . Opioid abuse     Past Surgical History:  Procedure Laterality Date  . TYMPANOSTOMY TUBE PLACEMENT Bilateral 1980s    Family History  Problem Relation Age of Onset  . Appendicitis Father   . Liver disease Neg Hx     Social History:  reports that he has been smoking Cigarettes.  He has a 8.50 pack-year smoking history. He has never used smokeless tobacco. He reports that he drinks alcohol. He reports that he does not use drugs.  Allergies: No Known Allergies  Medications: I have reviewed the patient's current medications.  Results for orders placed or performed during the hospital encounter of 05/25/16 (from the past 48 hour(s))  CBC with Differential     Status: Abnormal   Collection Time: 05/25/16 11:10 PM  Result Value Ref Range   WBC 11.7 (H) 4.0 - 10.5 K/uL   RBC 4.87 4.22 - 5.81 MIL/uL   Hemoglobin 14.2 13.0 - 17.0 g/dL   HCT 41.9 39.0 - 52.0 %   MCV 86.0 78.0 - 100.0 fL   MCH 29.2 26.0 - 34.0 pg   MCHC 33.9 30.0 - 36.0 g/dL   RDW 15.4 11.5 - 15.5 %   Platelets 310 150 - 400 K/uL   Neutrophils Relative % 55 %   Neutro Abs 6.4 1.7 - 7.7 K/uL   Lymphocytes Relative 35 %   Lymphs Abs 4.1 (H) 0.7 - 4.0 K/uL    Monocytes Relative 8 %   Monocytes Absolute 0.9 0.1 - 1.0 K/uL   Eosinophils Relative 2 %   Eosinophils Absolute 0.2 0.0 - 0.7 K/uL   Basophils Relative 0 %   Basophils Absolute 0.0 0.0 - 0.1 K/uL  Comprehensive metabolic panel     Status: Abnormal   Collection Time: 05/25/16 11:10 PM  Result Value Ref Range   Sodium 137 135 - 145 mmol/L   Potassium 3.6 3.5 - 5.1 mmol/L   Chloride 104 101 - 111 mmol/L   CO2 25 22 - 32 mmol/L   Glucose, Bld 102 (H) 65 - 99 mg/dL   BUN 15 6 - 20 mg/dL   Creatinine, Ser 0.81 0.61 - 1.24 mg/dL   Calcium 9.4 8.9 - 10.3 mg/dL   Total Protein 7.5 6.5 - 8.1 g/dL   Albumin 4.1 3.5 - 5.0 g/dL   AST 235 (H) 15 - 41 U/L   ALT 499 (H) 17 - 63 U/L   Alkaline Phosphatase 250 (H) 38 - 126 U/L   Total Bilirubin 8.3 (H) 0.3 - 1.2 mg/dL   GFR calc non Af Amer >60 >60 mL/min   GFR calc Af Amer >60 >60 mL/min    Comment: (NOTE) The eGFR has been calculated using the CKD EPI equation. This calculation has not  been validated in all clinical situations. eGFR's persistently <60 mL/min signify possible Chronic Kidney Disease.    Anion gap 8 5 - 15  Lipase, blood     Status: None   Collection Time: 05/25/16 11:10 PM  Result Value Ref Range   Lipase 40 11 - 51 U/L  Glucose, capillary     Status: Abnormal   Collection Time: 05/26/16  7:29 AM  Result Value Ref Range   Glucose-Capillary 116 (H) 65 - 99 mg/dL   Comment 1 Notify RN   Surgical pcr screen     Status: None   Collection Time: 05/26/16  8:33 PM  Result Value Ref Range   MRSA, PCR NEGATIVE NEGATIVE   Staphylococcus aureus NEGATIVE NEGATIVE    Comment:        The Xpert SA Assay (FDA approved for NASAL specimens in patients over 93 years of age), is one component of a comprehensive surveillance program.  Test performance has been validated by [facility] for patients greater than or equal to 36 year old. It is not intended to diagnose infection nor to guide or monitor treatment.   Glucose,  capillary     Status: Abnormal   Collection Time: 05/27/16  7:46 AM  Result Value Ref Range   Glucose-Capillary 104 (H) 65 - 99 mg/dL    Ct Abdomen Pelvis W Contrast  Result Date: 05/26/2016 CLINICAL DATA:  Right flank pain, right upper quadrant pain with nausea and vomiting for 2 weeks. Dark colored urine and difficulty urinating. EXAM: CT ABDOMEN AND PELVIS WITH CONTRAST TECHNIQUE: Multidetector CT imaging of the abdomen and pelvis was performed using the standard protocol following bolus administration of intravenous contrast. CONTRAST:  11m ISOVUE-300 IOPAMIDOL (ISOVUE-300) INJECTION 61% COMPARISON:  11/28/2014 CT. FINDINGS: Lower chest: Respiratory motion limits assessment. No pneumonic consolidation, effusion or pneumothorax. Normal sized cardiac chambers to the extent visible. No pericardial effusion. Hepatobiliary: There is intra and extrahepatic biliary dilatation which is a new finding since prior exam. The common bile duct is distended measuring up to 11 mm in caliber distal and projects into the lumen of the second portion of the duodenum. Differential possibilities may include a stricture at the ampulla or potentially a choledochal cyst variant. No definite obstructing calculus. The gallbladder is not distended. Pancreas: No pancreatic ductal dilatation or definite mass. Spleen: Small splenule is seen near the splenic hilum. There is no splenomegaly. Adrenals/Urinary Tract: Normal adrenal glands. No obstructive uropathy of the kidneys. No enhancing renal mass. No nephrolithiasis. Urinary bladder is distended without calculus nor wall thickening. Stomach/Bowel: No bowel obstruction or acute inflammation. Normal-appearing appendix. Normal bowel rotation. Vascular/Lymphatic: No significant vascular findings are present. No enlarged abdominal or pelvic lymph nodes. Reproductive: Prostate is unremarkable. Other: No abdominal wall hernia or abnormality. No abdominopelvic ascites. Musculoskeletal: No  acute or significant osseous findings. IMPRESSION: Dilated intra and extrahepatic bile ducts with slight protrusion of the distal common bile duct into the second portion of the duodenum. Differential possibilities may include stenosis at the ampulla or possibly a choledochal cyst variant though this finding was not present in 2016 (Ser. 4, Image 36). No choledocholithiasis is apparent. No focal mass is identified. Electronically Signed   By: DAshley RoyaltyM.D.   On: 05/26/2016 01:18   Mr 3d Recon At Scanner  Result Date: 05/26/2016 CLINICAL DATA:  Abdominal pain, jaundice and abnormal CT scan. EXAM: MRI ABDOMEN WITHOUT AND WITH CONTRAST (INCLUDING MRCP) TECHNIQUE: Multiplanar multisequence MR imaging of the abdomen was performed both before  and after the administration of intravenous contrast. Heavily T2-weighted images of the biliary and pancreatic ducts were obtained, and three-dimensional MRCP images were rendered by post processing. CONTRAST:  87m MULTIHANCE GADOBENATE DIMEGLUMINE 529 MG/ML IV SOLN COMPARISON:  CT scan 05/26/2016 and prior CT scan 11/28/2014 FINDINGS: Lower chest: The lung bases are grossly clear. No infiltrates or effusion. No pericardial effusion. Hepatobiliary: No focal hepatic lesions are identified. There is intrahepatic biliary dilatation as demonstrated on the recent CT scan. There is also moderate common bile duct dilatation measuring a maximum of 10 mm. The common bile duct protrudes into the duodenum but I do not see any evidence of a ampullary mass or pancreatic mass. The cholangiogram images are somewhat limited by motion but findings are suspicious for small distal common bile duct stone best seen on series 6, image 26 and also on series 12, image number 4. Numerous small layering gallstones are noted in the gallbladder. Pancreas:  No mass, inflammation or ductal dilatation. Spleen:  Normal size.  No focal lesions. Adrenals/Urinary Tract:  The adrenal glands and kidneys are  normal. Stomach/Bowel: The stomach, duodenum, visualized small bowel and visualize colon are grossly normal. Vascular/Lymphatic: No pathologically enlarged lymph nodes identified. No abdominal aortic aneurysm demonstrated. Other:  No ascites or abdominal wall hernia. Musculoskeletal: No significant bony findings. IMPRESSION: 1. Intra and extrahepatic biliary dilatation likely due to a small focal distal common bile duct stone. 2. Numerous small layering gallstones in gallbladder. 3. Normal pancreatic duct and no pancreatic head mass or ampullary lesion. Electronically Signed   By: PMarijo SanesM.D.   On: 05/26/2016 19:42   Dg Ercp Biliary & Pancreatic Ducts  Result Date: 05/27/2016 CLINICAL DATA:  Bile duct stone EXAM: ERCP TECHNIQUE: Multiple spot images obtained with the fluoroscopic device and submitted for interpretation post-procedure. FLUOROSCOPY TIME:  Fluoroscopy Time:  1 minutes and 25 second Radiation Exposure Index (if provided by the fluoroscopic device): Number of Acquired Spot Images: Four COMPARISON:  None. FINDINGS: Contrast fills the common bile duct. Balloon stone retrieval is documented on the images. IMPRESSION: See above. These images were submitted for radiologic interpretation only. Please see the procedural report for the amount of contrast and the fluoroscopy time utilized. Electronically Signed   By: AMarybelle KillingsM.D.   On: 05/27/2016 13:57   Mr Abdomen Mrcp WMoise BoringContast  Result Date: 05/26/2016 CLINICAL DATA:  Abdominal pain, jaundice and abnormal CT scan. EXAM: MRI ABDOMEN WITHOUT AND WITH CONTRAST (INCLUDING MRCP) TECHNIQUE: Multiplanar multisequence MR imaging of the abdomen was performed both before and after the administration of intravenous contrast. Heavily T2-weighted images of the biliary and pancreatic ducts were obtained, and three-dimensional MRCP images were rendered by post processing. CONTRAST:  28mMULTIHANCE GADOBENATE DIMEGLUMINE 529 MG/ML IV SOLN COMPARISON:   CT scan 05/26/2016 and prior CT scan 11/28/2014 FINDINGS: Lower chest: The lung bases are grossly clear. No infiltrates or effusion. No pericardial effusion. Hepatobiliary: No focal hepatic lesions are identified. There is intrahepatic biliary dilatation as demonstrated on the recent CT scan. There is also moderate common bile duct dilatation measuring a maximum of 10 mm. The common bile duct protrudes into the duodenum but I do not see any evidence of a ampullary mass or pancreatic mass. The cholangiogram images are somewhat limited by motion but findings are suspicious for small distal common bile duct stone best seen on series 6, image 26 and also on series 12, image number 4. Numerous small layering gallstones are noted in the gallbladder.  Pancreas:  No mass, inflammation or ductal dilatation. Spleen:  Normal size.  No focal lesions. Adrenals/Urinary Tract:  The adrenal glands and kidneys are normal. Stomach/Bowel: The stomach, duodenum, visualized small bowel and visualize colon are grossly normal. Vascular/Lymphatic: No pathologically enlarged lymph nodes identified. No abdominal aortic aneurysm demonstrated. Other:  No ascites or abdominal wall hernia. Musculoskeletal: No significant bony findings. IMPRESSION: 1. Intra and extrahepatic biliary dilatation likely due to a small focal distal common bile duct stone. 2. Numerous small layering gallstones in gallbladder. 3. Normal pancreatic duct and no pancreatic head mass or ampullary lesion. Electronically Signed   By: Marijo Sanes M.D.   On: 05/26/2016 19:42    Review of Systems  Constitutional: Negative for chills and fever.  HENT: Negative for congestion and sore throat.   Respiratory: Negative for cough and shortness of breath.   Cardiovascular: Negative for chest pain.  Gastrointestinal: Positive for abdominal pain. Negative for diarrhea, nausea and vomiting.  Genitourinary: Positive for dysuria.  Neurological: Negative for weakness.  All other  systems reviewed and are negative.  Blood pressure (!) 104/52, pulse 60, temperature 98.3 F (36.8 C), temperature source Oral, resp. rate 17, height 6' 2"  (1.88 m), weight 94.9 kg (209 lb 3.2 oz), SpO2 98 %. Physical Exam  Constitutional: He is oriented to person, place, and time. He appears well-developed and well-nourished. No distress.  HENT:  Head: Normocephalic and atraumatic.  Right Ear: External ear normal.  Left Ear: External ear normal.  Nose: Nose normal.  Mouth/Throat: Oropharynx is clear and moist. No oropharyngeal exudate.  Eyes: Conjunctivae are normal. Pupils are equal, round, and reactive to light. Right eye exhibits no discharge. Left eye exhibits no discharge. Scleral icterus is present.  Neck: Normal range of motion. No tracheal deviation present.  Cardiovascular: Normal rate, regular rhythm, normal heart sounds and intact distal pulses.   No murmur heard. Respiratory: Effort normal and breath sounds normal. No respiratory distress. He has no wheezes. He exhibits no tenderness.  GI: Soft. He exhibits no distension. There is no tenderness. There is no guarding.  Musculoskeletal: Normal range of motion. He exhibits no edema or tenderness.  Lymphadenopathy:    He has no cervical adenopathy.  Neurological: He is alert and oriented to person, place, and time.  Skin: Skin is warm and dry. No rash noted. He is not diaphoretic. No erythema.  Psychiatric: His behavior is normal. Judgment normal.    Assessment/Plan: Symptomatic cholelithiasis with common bile duct stone  I discusses with patient and his wife. Laparoscopic cholecystectomy is now recommended. I discussed the reasoning for this with him in detail. He is doing very well after the ERCP. I have made him npo at midnight tonight for possible surgery tomorrow. I did briefly discussed procedure with him. Timing will depend on Dr. Josetta Huddle OR schedule tomorrow.  Uzziah Rigg A 05/27/2016, 10:34 PM

## 2016-05-29 ENCOUNTER — Encounter (HOSPITAL_COMMUNITY): Payer: Self-pay | Admitting: Surgery

## 2016-05-29 LAB — CBC
HCT: 33.9 % — ABNORMAL LOW (ref 39.0–52.0)
HEMOGLOBIN: 11 g/dL — AB (ref 13.0–17.0)
MCH: 28.5 pg (ref 26.0–34.0)
MCHC: 32.4 g/dL (ref 30.0–36.0)
MCV: 87.8 fL (ref 78.0–100.0)
PLATELETS: 230 10*3/uL (ref 150–400)
RBC: 3.86 MIL/uL — ABNORMAL LOW (ref 4.22–5.81)
RDW: 16.3 % — AB (ref 11.5–15.5)
WBC: 8.9 10*3/uL (ref 4.0–10.5)

## 2016-05-29 MED ORDER — IBUPROFEN 200 MG PO TABS: ORAL_TABLET | ORAL | 2 refills | Status: DC

## 2016-05-29 MED ORDER — ACETAMINOPHEN 325 MG PO TABS: ORAL_TABLET | ORAL | Status: AC

## 2016-05-29 MED ORDER — OXYCODONE-ACETAMINOPHEN 5-325 MG PO TABS: 1.0000 | ORAL_TABLET | ORAL | 0 refills | Status: DC | PRN

## 2016-05-29 NOTE — Discharge Instructions (Signed)
[facility] ______[facility] °LAPAROSCOPIC SURGERY: POST OP INSTRUCTIONS °Always review your discharge instruction sheet given to you by the facility where your surgery was performed. °IF YOU HAVE DISABILITY OR FAMILY LEAVE FORMS, YOU MUST BRING THEM TO THE OFFICE FOR PROCESSING.   °DO NOT GIVE THEM TO YOUR DOCTOR. ° °1. A prescription for pain medication may be given to you upon discharge.  Take your pain medication as prescribed, if needed.  If narcotic pain medicine is not needed, then you may take acetaminophen (Tylenol) or ibuprofen (Advil) as needed. °2. Take your usually prescribed medications unless otherwise directed. °3. If you need a refill on your pain medication, please contact your pharmacy.  They will contact our office to request authorization. Prescriptions will not be filled after 5pm or on week-ends. °4. You should follow a light diet the first few days after arrival home, such as soup and crackers, etc.  Be sure to include lots of fluids daily. °5. Most patients will experience some swelling and bruising in the area of the incisions.  Ice packs will help.  Swelling and bruising can take several days to resolve.  °6. It is common to experience some constipation if taking pain medication after surgery.  Increasing fluid intake and taking a stool softener (such as Colace) will usually help or prevent this problem from occurring.  A mild laxative (Milk of Magnesia or Miralax) should be taken according to package instructions if there are no bowel movements after 48 hours. °7. Unless discharge instructions indicate otherwise, you may remove your bandages 24-48 hours after surgery, and you may shower at that time.  You may have steri-strips (small skin tapes) in place directly over the incision.  These strips should be left on the skin for 7-10 days.  If your surgeon used skin glue on the incision, you may shower in 24 hours.  The glue will flake off over the next 2-3 weeks.  Any sutures or  staples will be removed at the office during your follow-up visit. °8. ACTIVITIES:  You may resume regular (light) daily activities beginning the next day--such as daily self-care, walking, climbing stairs--gradually increasing activities as tolerated.  You may have sexual intercourse when it is comfortable.  Refrain from any heavy lifting or straining until approved by your doctor. °a. You may drive when you are no longer taking prescription pain medication, you can comfortably wear a seatbelt, and you can safely maneuver your car and apply brakes. °b. RETURN TO WORK:  __________________________________________________________ °9. You should see your doctor in the office for a follow-up appointment approximately 2-3 weeks after your surgery.  Make sure that you call for this appointment within a day or two after you arrive home to insure a convenient appointment time. °10. OTHER INSTRUCTIONS: __________________________________________________________________________________________________________________________ __________________________________________________________________________________________________________________________ °WHEN TO CALL YOUR DOCTOR: °1. Fever over 101.0 °2. Inability to urinate °3. Continued bleeding from incision. °4. Increased pain, redness, or drainage from the incision. °5. Increasing abdominal pain ° °The clinic staff is available to answer your questions during regular business hours.  Please don’t hesitate to call and ask to speak to one of the nurses for clinical concerns.  If you have a medical emergency, go to the nearest emergency room or call 911.  A surgeon from [facility] is always on call at the hospital. °[address], [address], [place], [place]  [address] ? [address] [address], [place], [place]   [address] °[phone] ? [phone] ? FAX [phone] °Web site:   [email or web address] ° °Laparoscopic Cholecystectomy, Care After °Refer to this  sheet in the next few weeks. These instructions provide you with information about caring for yourself after your procedure. Your health care provider may also give you more specific instructions. Your treatment has been planned according to current medical practices, but problems sometimes occur. Call your health care provider if you have any problems or questions after your procedure. °WHAT TO EXPECT AFTER THE PROCEDURE °After your procedure, it is common to have: °· Pain at your incision sites. You will be given pain medicines to control your pain. °· Mild nausea or vomiting. This should improve after the first 24 hours. °· Bloating and possible shoulder pain from the gas that was used during the procedure. This will improve after the first 24 hours. °HOME CARE INSTRUCTIONS °Incision Care °· Follow instructions from your health care provider about how to take care of your incisions. Make sure you: °¨ Wash your hands with soap and water before you change your bandage (dressing). If soap and water are not available, use hand sanitizer. °¨ Change your dressing as told by your health care provider. °¨ Leave stitches (sutures), skin glue, or adhesive strips in place. These skin closures may need to be in place for 2 weeks or longer. If adhesive strip edges start to loosen and curl up, you may trim the loose edges. Do not remove adhesive strips completely unless your health care provider tells you to do that. °· Do not take baths, swim, or use a hot tub until your health care provider approves. Ask your health care provider if you can take showers. You may only be allowed to take sponge baths for bathing. °General Instructions °· Take over-the-counter and prescription medicines only as told by your health care provider. °· Do not drive or operate heavy machinery while taking prescription pain medicine. °· Return to your normal diet as told by your health care provider. °· Do not lift anything that is heavier than 10 lb  (4.5 kg). °· Do not play contact sports for one week or until your health care provider approves. °SEEK MEDICAL CARE IF:  °· You have redness, swelling, or pain at the site of your incision. °· You have fluid, blood, or pus coming from your incision. °· You notice a bad smell coming from your incision area. °· Your surgical incisions break open. °· You have a fever. °SEEK IMMEDIATE MEDICAL CARE IF: °· You develop a rash. °· You have difficulty breathing. °· You have chest pain. °· You have increasing pain in your shoulders (shoulder strap areas). °· You faint or have dizzy episodes while you are standing. °· You have severe pain in your abdomen. °· You have nausea or vomiting that lasts for more than one day. °  °This information is not intended to replace advice given to you by your health care provider. Make sure you discuss any questions you have with your health care provider. °  °Document Released: 07/06/2005 Document Revised: 03/27/2015 Document Reviewed: 02/15/2013 °Elsevier Interactive Patient Education ©2016 [employer] ° °

## 2016-05-29 NOTE — Discharge Summary (Signed)
Physician Discharge Summary  Rafael BihariJacob R Esham ZOX:[id number] DOB: 01/06/1981 DOA: 05/25/2016  PCP: Colette RibasGOLDING, JOHN CABOT, MD  Admit date: 05/25/2016 Discharge date: 05/29/2016  Recommendations for Outpatient Follow-up:  1. Pt will need to follow up with PCP in 2-3 weeks post discharge  Discharge Diagnoses:  Principal Problem:   Biliary obstruction Active Problems:   Leukocytosis   Hyperbilirubinemia   Elevated LFTs   Nausea & vomiting   GERD (gastroesophageal reflux disease)  Discharge Condition: Stable  Diet recommendation: Heart healthy diet discussed in details   Brief Narrative:  35 y.o.malewith medical history significant foropiate abuse now in remission who presents to the emergency department with approximately 10 days of right upper quadrant pain with nausea and vomiting. Patient reports that he had been in his usual state of health until the insidious development of a mild discomfort in the epigastrium and right upper quadrant approximately 10 days ago. He noted that it was worse with eating and initially suspected this was related to his GERD.   Assessment & Plan:   Principal Problem:   Biliary obstruction, intra and extra hepatic, transaminitis, choledocholithiasis  - per ERCP, thhe entire main bile duct was moderately dilated,  - Complete removal was accomplished by biliary sphincterotomy and balloon extraction. - biliary sphincterotomy performed. - s/p lap chole today, appreciate GI and surgery team assistance - pt tolerating diet well, wants to go home, surgery team cleared for discharge   Active Problems:   Leukocytosis - secondary to the above - resolved   DVT prophylaxis: Lovenox SQ Code Status: Full Family Communication: Patient and significant other at bedside  Disposition Plan: Home   Consultants:   GI  Surgery   Procedures:   ERCP 05/27/2016  Antimicrobials:   None  Procedures/Studies: Ct Abdomen Pelvis W Contrast  Result  Date: 05/26/2016 CLINICAL DATA:  Right flank pain, right upper quadrant pain with nausea and vomiting for 2 weeks. Dark colored urine and difficulty urinating. EXAM: CT ABDOMEN AND PELVIS WITH CONTRAST TECHNIQUE: Multidetector CT imaging of the abdomen and pelvis was performed using the standard protocol following bolus administration of intravenous contrast. CONTRAST:  100mL ISOVUE-300 IOPAMIDOL (ISOVUE-300) INJECTION 61% COMPARISON:  11/28/2014 CT. FINDINGS: Lower chest: Respiratory motion limits assessment. No pneumonic consolidation, effusion or pneumothorax. Normal sized cardiac chambers to the extent visible. No pericardial effusion. Hepatobiliary: There is intra and extrahepatic biliary dilatation which is a new finding since prior exam. The common bile duct is distended measuring up to 11 mm in caliber distal and projects into the lumen of the second portion of the duodenum. Differential possibilities may include a stricture at the ampulla or potentially a choledochal cyst variant. No definite obstructing calculus. The gallbladder is not distended. Pancreas: No pancreatic ductal dilatation or definite mass. Spleen: Small splenule is seen near the splenic hilum. There is no splenomegaly. Adrenals/Urinary Tract: Normal adrenal glands. No obstructive uropathy of the kidneys. No enhancing renal mass. No nephrolithiasis. Urinary bladder is distended without calculus nor wall thickening. Stomach/Bowel: No bowel obstruction or acute inflammation. Normal-appearing appendix. Normal bowel rotation. Vascular/Lymphatic: No significant vascular findings are present. No enlarged abdominal or pelvic lymph nodes. Reproductive: Prostate is unremarkable. Other: No abdominal wall hernia or abnormality. No abdominopelvic ascites. Musculoskeletal: No acute or significant osseous findings. IMPRESSION: Dilated intra and extrahepatic bile ducts with slight protrusion of the distal common bile duct into the second portion of the  duodenum. Differential possibilities may include stenosis at the ampulla or possibly a choledochal cyst variant though this  finding was not present in 2016 (Ser. 4, Image 36). No choledocholithiasis is apparent. No focal mass is identified. Electronically Signed   By: Tollie Eth M.D.   On: 05/26/2016 01:18   Mr 3d Recon At Scanner  Result Date: 05/26/2016 CLINICAL DATA:  Abdominal pain, jaundice and abnormal CT scan. EXAM: MRI ABDOMEN WITHOUT AND WITH CONTRAST (INCLUDING MRCP) TECHNIQUE: Multiplanar multisequence MR imaging of the abdomen was performed both before and after the administration of intravenous contrast. Heavily T2-weighted images of the biliary and pancreatic ducts were obtained, and three-dimensional MRCP images were rendered by post processing. CONTRAST:  20mL MULTIHANCE GADOBENATE DIMEGLUMINE 529 MG/ML IV SOLN COMPARISON:  CT scan 05/26/2016 and prior CT scan 11/28/2014 FINDINGS: Lower chest: The lung bases are grossly clear. No infiltrates or effusion. No pericardial effusion. Hepatobiliary: No focal hepatic lesions are identified. There is intrahepatic biliary dilatation as demonstrated on the recent CT scan. There is also moderate common bile duct dilatation measuring a maximum of 10 mm. The common bile duct protrudes into the duodenum but I do not see any evidence of a ampullary mass or pancreatic mass. The cholangiogram images are somewhat limited by motion but findings are suspicious for small distal common bile duct stone best seen on series 6, image 26 and also on series 12, image number 4. Numerous small layering gallstones are noted in the gallbladder. Pancreas:  No mass, inflammation or ductal dilatation. Spleen:  Normal size.  No focal lesions. Adrenals/Urinary Tract:  The adrenal glands and kidneys are normal. Stomach/Bowel: The stomach, duodenum, visualized small bowel and visualize colon are grossly normal. Vascular/Lymphatic: No pathologically enlarged lymph nodes identified. No  abdominal aortic aneurysm demonstrated. Other:  No ascites or abdominal wall hernia. Musculoskeletal: No significant bony findings. IMPRESSION: 1. Intra and extrahepatic biliary dilatation likely due to a small focal distal common bile duct stone. 2. Numerous small layering gallstones in gallbladder. 3. Normal pancreatic duct and no pancreatic head mass or ampullary lesion. Electronically Signed   By: Rudie Meyer M.D.   On: 05/26/2016 19:42   Dg Ercp Biliary & Pancreatic Ducts  Result Date: 05/27/2016 CLINICAL DATA:  Bile duct stone EXAM: ERCP TECHNIQUE: Multiple spot images obtained with the fluoroscopic device and submitted for interpretation post-procedure. FLUOROSCOPY TIME:  Fluoroscopy Time:  1 minutes and 25 second Radiation Exposure Index (if provided by the fluoroscopic device): Number of Acquired Spot Images: Four COMPARISON:  None. FINDINGS: Contrast fills the common bile duct. Balloon stone retrieval is documented on the images. IMPRESSION: See above. These images were submitted for radiologic interpretation only. Please see the procedural report for the amount of contrast and the fluoroscopy time utilized. Electronically Signed   By: Jolaine Click M.D.   On: 05/27/2016 13:57   Mr Abdomen Mrcp Vivien Rossetti Contast  Result Date: 05/26/2016 CLINICAL DATA:  Abdominal pain, jaundice and abnormal CT scan. EXAM: MRI ABDOMEN WITHOUT AND WITH CONTRAST (INCLUDING MRCP) TECHNIQUE: Multiplanar multisequence MR imaging of the abdomen was performed both before and after the administration of intravenous contrast. Heavily T2-weighted images of the biliary and pancreatic ducts were obtained, and three-dimensional MRCP images were rendered by post processing. CONTRAST:  20mL MULTIHANCE GADOBENATE DIMEGLUMINE 529 MG/ML IV SOLN COMPARISON:  CT scan 05/26/2016 and prior CT scan 11/28/2014 FINDINGS: Lower chest: The lung bases are grossly clear. No infiltrates or effusion. No pericardial effusion. Hepatobiliary: No focal  hepatic lesions are identified. There is intrahepatic biliary dilatation as demonstrated on the recent CT scan. There is also moderate  common bile duct dilatation measuring a maximum of 10 mm. The common bile duct protrudes into the duodenum but I do not see any evidence of a ampullary mass or pancreatic mass. The cholangiogram images are somewhat limited by motion but findings are suspicious for small distal common bile duct stone best seen on series 6, image 26 and also on series 12, image number 4. Numerous small layering gallstones are noted in the gallbladder. Pancreas:  No mass, inflammation or ductal dilatation. Spleen:  Normal size.  No focal lesions. Adrenals/Urinary Tract:  The adrenal glands and kidneys are normal. Stomach/Bowel: The stomach, duodenum, visualized small bowel and visualize colon are grossly normal. Vascular/Lymphatic: No pathologically enlarged lymph nodes identified. No abdominal aortic aneurysm demonstrated. Other:  No ascites or abdominal wall hernia. Musculoskeletal: No significant bony findings. IMPRESSION: 1. Intra and extrahepatic biliary dilatation likely due to a small focal distal common bile duct stone. 2. Numerous small layering gallstones in gallbladder. 3. Normal pancreatic duct and no pancreatic head mass or ampullary lesion. Electronically Signed   By: Rudie Meyer M.D.   On: 05/26/2016 19:42     Discharge Exam: Vitals:   05/28/16 2033 05/29/16 0532  BP: (!) 125/59 (!) 101/55  Pulse: 73 69  Resp: 16 16  Temp: 99.1 F (37.3 C) 98.2 F (36.8 C)   Vitals:   05/28/16 1320 05/28/16 1359 05/28/16 2033 05/29/16 0532  BP: 126/80 117/64 (!) 125/59 (!) 101/55  Pulse: 68 72 73 69  Resp: 16 16 16 16   Temp:  98.4 F (36.9 C) 99.1 F (37.3 C) 98.2 F (36.8 C)  TempSrc:  Oral Oral Oral  SpO2: 99% 100% 99% 100%  Weight:      Height:        General: Pt is alert, follows commands appropriately, not in acute distress Cardiovascular: Regular rate and rhythm,  S1/S2 +, no murmurs, no rubs, no gallops Respiratory: Clear to auscultation bilaterally, no wheezing, no crackles, no rhonchi Abdominal: Soft, non tender, non distended, bowel sounds +, no guarding Extremities: no edema, no cyanosis, pulses palpable bilaterally DP and PT Neuro: Grossly nonfocal  Discharge Instructions  Discharge Instructions    Diet - low sodium heart healthy    Complete by:  As directed    Increase activity slowly    Complete by:  As directed        Medication List    STOP taking these medications   naproxen sodium 220 MG tablet Commonly known as:  ANAPROX   omeprazole 20 MG capsule Commonly known as:  PRILOSEC     TAKE these medications   acetaminophen 325 MG tablet Commonly known as:  TYLENOL You can take 2 tablets every 4 hours as needed. Tylenol(acetaminophen) is in your prescription medication. He cannot take more than 4000 mg per day.   ibuprofen 200 MG tablet Commonly known as:  MOTRIN IB You can take 2-3 tablets every 6 hours as needed for pain.  I would use this first. Use prescribed pain medicine with narcotic as second line treatment.   oxyCODONE-acetaminophen 5-325 MG tablet Commonly known as:  PERCOCET/ROXICET Take 1-2 tablets by mouth every 4 (four) hours as needed for moderate pain.      Follow-up Information    CORNETT,THOMAS A., MD Follow up.   Specialty:  General Surgery Why:  call for an appointment in 2 weeks.  Contact information: [address] [place] [address] [phone]        Assunta Found CABOT,  MD Follow up.   Specialty:  Family Medicine Contact information: [address] [place] [address] [phone]            The results of significant diagnostics from this hospitalization (including imaging, microbiology, ancillary and laboratory) are listed below for reference.     Microbiology: Recent Results (from the past 240 hour(s))  Surgical pcr screen     Status: None    Collection Time: 05/26/16  8:33 PM  Result Value Ref Range Status   MRSA, PCR NEGATIVE NEGATIVE Final   Staphylococcus aureus NEGATIVE NEGATIVE Final     Labs: Basic Metabolic Panel:  Recent Labs Lab 05/25/16 2310 05/28/16 0406 05/28/16 0944  NA 137 141 138  K 3.6 4.0 5.1  CL 104 109 108  CO2 25 26 26   GLUCOSE 102* 131* 113*  BUN 15 13 14   CREATININE 0.81 0.91 0.94  CALCIUM 9.4 8.7* 8.9   Liver Function Tests:  Recent Labs Lab 05/25/16 2310 05/28/16 0406 05/28/16 0944  AST 235* 108* 108*  ALT 499* 298* 301*  ALKPHOS 250* 179* 191*  BILITOT 8.3* 3.4* 3.4*  PROT 7.5 5.8* 5.9*  ALBUMIN 4.1 3.0* 3.0*    Recent Labs Lab 05/25/16 2310  LIPASE 40   CBC:  Recent Labs Lab 05/25/16 2310 05/28/16 0406 05/29/16 0414  WBC 11.7* 9.4 8.9  NEUTROABS 6.4  --   --   HGB 14.2 11.1* 11.0*  HCT 41.9 34.2* 33.9*  MCV 86.0 87.0 87.8  PLT 310 221 230   CBG:  Recent Labs Lab 05/26/16 0729 05/27/16 0746  GLUCAP 116* 104*    SIGNED: Time coordinating discharge: 30 minutes  MAGICK-Courvoisier Hamblen, MD  [facility] 05/29/2016, 9:55 AM Pager [phone]  If 7PM-7AM, please contact night-coverage [email or web address] Password [email or web address]

## 2016-05-29 NOTE — Progress Notes (Signed)
Patient discharged to home with instructions and prescription. 

## 2016-05-29 NOTE — Progress Notes (Signed)
1 Day Post-Op  Subjective: He looks very good this a.m. port sites look good. He's eating breakfast. Pain well controlled with oral pain medications.  Objective: Vital signs in last 24 hours: Temp:  [97.3 F (36.3 C)-99.1 F (37.3 C)] 98.2 F (36.8 C) (11/10 0532) Pulse Rate:  [68-73] 69 (11/10 0532) Resp:  [16-18] 16 (11/10 0532) BP: (101-126)/(55-86) 101/55 (11/10 0532) SpO2:  [99 %-100 %] 100 % (11/10 0532) Last BM Date: 05/25/16 2 liter IV No PO recorded 1300 urine Afebrile, VSS CBC stable, no LFT's  Intake/Output from previous day: 11/09 0701 - 11/10 0700 In: 2075 [I.V.:2075] Out: 1300 [Urine:1300] Intake/Output this shift: No intake/output data recorded.  General appearance: alert, cooperative and no distress GI: Soft, sore, sites all look good no abdominal distention or abnormal discomfort.  Lab Results:   Recent Labs  05/28/16 0406 05/29/16 0414  WBC 9.4 8.9  HGB 11.1* 11.0*  HCT 34.2* 33.9*  PLT 221 230    BMET  Recent Labs  05/28/16 0406 05/28/16 0944  NA 141 138  K 4.0 5.1  CL 109 108  CO2 26 26  GLUCOSE 131* 113*  BUN 13 14  CREATININE 0.91 0.94  CALCIUM 8.7* 8.9   PT/INR No results for input(s): LABPROT, INR in the last 72 hours.   Recent Labs Lab 05/25/16 2310 05/28/16 0406 05/28/16 0944  AST 235* 108* 108*  ALT 499* 298* 301*  ALKPHOS 250* 179* 191*  BILITOT 8.3* 3.4* 3.4*  PROT 7.5 5.8* 5.9*  ALBUMIN 4.1 3.0* 3.0*     Lipase     Component Value Date/Time   LIPASE 40 05/25/2016 2310     Studies/Results: Dg Ercp Biliary & Pancreatic Ducts  Result Date: 05/27/2016 CLINICAL DATA:  Bile duct stone EXAM: ERCP TECHNIQUE: Multiple spot images obtained with the fluoroscopic device and submitted for interpretation post-procedure. FLUOROSCOPY TIME:  Fluoroscopy Time:  1 minutes and 25 second Radiation Exposure Index (if provided by the fluoroscopic device): Number of Acquired Spot Images: Four COMPARISON:  None. FINDINGS:  Contrast fills the common bile duct. Balloon stone retrieval is documented on the images. IMPRESSION: See above. These images were submitted for radiologic interpretation only. Please see the procedural report for the amount of contrast and the fluoroscopy time utilized. Electronically Signed   By: Jolaine ClickArthur  Hoss M.D.   On: 05/27/2016 13:57    Medications: . enoxaparin (LOVENOX) injection  40 mg Subcutaneous Q24H  . feeding supplement (ENSURE ENLIVE)  237 mL Oral BID BM  . Influenza vac split quadrivalent PF  0.5 mL Intramuscular Tomorrow-1000  . nicotine  14 mg Transdermal Daily  . pantoprazole  40 mg Oral Daily  . pneumococcal 23 valent vaccine  0.5 mL Intramuscular Tomorrow-1000  . QUEtiapine  50 mg Oral QHS   . sodium chloride 50 mL/hr at 05/28/16 1504   Assessment/Plan Symptomatic cholelithiasis with common bile duct stone S/p ERCP, 05/27/16 Dr. Dorena CookeyJohn Hayes Status post laparoscopic cholecystectomy, 05/28/16 Dr. Maisie Fushomas Cornett FEN:  IV fluids/regular diet ID:  Cipro one dose yesterday, Rocephin this x 1 dose pre op DVT:  Lovenox    Plan: Plan discharge after breakfast. Discharge instructions reviewed.   LOS: 3 days    Brad Ramirez 05/29/2016 [phone]

## 2016-05-29 NOTE — Care Management Note (Signed)
Case Management Note  Patient Details  Name: Brad Ramirez MRN: [id number] Date of Birth: 09/12/1980  Subjective/Objective:                    Action/Plan: Pt discharging home with wife. Pt without insurance but states his wife has signed him up for [employer]. CM provided him with a coupon for his percocet. [facility]. Pt states this is affordable for him. His other meds at discharge are over the counter. No further needs per CM.   Expected Discharge Date:                  Expected Discharge Plan:  Home/Self Care  In-House Referral:     Discharge planning Services  CM Consult  Post Acute Care Choice:    Choice offered to:     DME Arranged:    DME Agency:     HH Arranged:    HH Agency:     Status of Service:  Completed, signed off  If discussed at [employer] Length of Stay Meetings, dates discussed:    Additional Comments:  Kermit BaloKelli F Wylan Gentzler, RN 05/29/2016, 9:21 AM

## 2016-11-26 ENCOUNTER — Inpatient Hospital Stay (HOSPITAL_COMMUNITY): Payer: BLUE CROSS/BLUE SHIELD

## 2016-11-26 ENCOUNTER — Inpatient Hospital Stay (HOSPITAL_COMMUNITY)
Admission: EM | Admit: 2016-11-26 | Discharge: 2016-11-29 | DRG: 871 | Disposition: A | Discharge status: Home / Self Care | Payer: BLUE CROSS/BLUE SHIELD | Attending: Internal Medicine | Admitting: Internal Medicine

## 2016-11-26 ENCOUNTER — Encounter (HOSPITAL_COMMUNITY): Payer: Self-pay | Admitting: Emergency Medicine

## 2016-11-26 ENCOUNTER — Emergency Department (HOSPITAL_COMMUNITY): Payer: BLUE CROSS/BLUE SHIELD

## 2016-11-26 DIAGNOSIS — A419 Sepsis, unspecified organism: Principal | ICD-10-CM | POA: Diagnosis present

## 2016-11-26 DIAGNOSIS — R1033 Periumbilical pain: Secondary | ICD-10-CM | POA: Diagnosis present

## 2016-11-26 DIAGNOSIS — F1910 Other psychoactive substance abuse, uncomplicated: Secondary | ICD-10-CM | POA: Diagnosis present

## 2016-11-26 DIAGNOSIS — R1031 Right lower quadrant pain: Secondary | ICD-10-CM | POA: Diagnosis present

## 2016-11-26 DIAGNOSIS — R109 Unspecified abdominal pain: Secondary | ICD-10-CM

## 2016-11-26 DIAGNOSIS — R7989 Other specified abnormal findings of blood chemistry: Secondary | ICD-10-CM | POA: Diagnosis present

## 2016-11-26 DIAGNOSIS — J181 Lobar pneumonia, unspecified organism: Secondary | ICD-10-CM | POA: Diagnosis present

## 2016-11-26 DIAGNOSIS — J189 Pneumonia, unspecified organism: Secondary | ICD-10-CM | POA: Diagnosis not present

## 2016-11-26 DIAGNOSIS — N179 Acute kidney failure, unspecified: Secondary | ICD-10-CM | POA: Diagnosis present

## 2016-11-26 DIAGNOSIS — Z79899 Other long term (current) drug therapy: Secondary | ICD-10-CM | POA: Diagnosis not present

## 2016-11-26 DIAGNOSIS — R945 Abnormal results of liver function studies: Secondary | ICD-10-CM

## 2016-11-26 DIAGNOSIS — K219 Gastro-esophageal reflux disease without esophagitis: Secondary | ICD-10-CM | POA: Diagnosis present

## 2016-11-26 DIAGNOSIS — R739 Hyperglycemia, unspecified: Secondary | ICD-10-CM | POA: Diagnosis present

## 2016-11-26 DIAGNOSIS — E860 Dehydration: Secondary | ICD-10-CM | POA: Diagnosis present

## 2016-11-26 DIAGNOSIS — R6521 Severe sepsis with septic shock: Secondary | ICD-10-CM | POA: Diagnosis present

## 2016-11-26 DIAGNOSIS — J9601 Acute respiratory failure with hypoxia: Secondary | ICD-10-CM | POA: Diagnosis present

## 2016-11-26 DIAGNOSIS — J690 Pneumonitis due to inhalation of food and vomit: Secondary | ICD-10-CM | POA: Diagnosis present

## 2016-11-26 DIAGNOSIS — R112 Nausea with vomiting, unspecified: Secondary | ICD-10-CM

## 2016-11-26 LAB — BLOOD GAS, VENOUS
ACID-BASE DEFICIT: 13.2 mmol/L — AB (ref 0.0–2.0)
Bicarbonate: 11.1 mmol/L — ABNORMAL LOW (ref 20.0–28.0)
FIO2: 21
O2 SAT: 48.6 %
PATIENT TEMPERATURE: 37
PO2 VEN: 37.5 mmHg (ref 32.0–45.0)
pCO2, Ven: 74.8 mmHg (ref 44.0–60.0)
pH, Ven: 6.978 — CL (ref 7.250–7.430)

## 2016-11-26 LAB — CBC WITH DIFFERENTIAL/PLATELET
Basophils Absolute: 0.1 10*3/uL (ref 0.0–0.1)
Basophils Relative: 1 %
Eosinophils Absolute: 0.4 10*3/uL (ref 0.0–0.7)
Eosinophils Relative: 2 %
HCT: 45.4 % (ref 39.0–52.0)
HEMOGLOBIN: 14.9 g/dL (ref 13.0–17.0)
LYMPHS ABS: 10.4 10*3/uL — AB (ref 0.7–4.0)
Lymphocytes Relative: 48 %
MCH: 29.6 pg (ref 26.0–34.0)
MCHC: 32.8 g/dL (ref 30.0–36.0)
MCV: 90.1 fL (ref 78.0–100.0)
MONO ABS: 0.5 10*3/uL (ref 0.1–1.0)
MONOS PCT: 2 %
NEUTROS ABS: 10.3 10*3/uL — AB (ref 1.7–7.7)
NEUTROS PCT: 48 %
Platelets: 255 10*3/uL (ref 150–400)
RBC: 5.04 MIL/uL (ref 4.22–5.81)
RDW: 14 % (ref 11.5–15.5)
WBC: 21.6 10*3/uL — ABNORMAL HIGH (ref 4.0–10.5)

## 2016-11-26 LAB — URINALYSIS, ROUTINE W REFLEX MICROSCOPIC
BILIRUBIN URINE: NEGATIVE
GLUCOSE, UA: 150 mg/dL — AB
Hgb urine dipstick: NEGATIVE
KETONES UR: NEGATIVE mg/dL
Leukocytes, UA: NEGATIVE
Nitrite: NEGATIVE
PROTEIN: 100 mg/dL — AB
Specific Gravity, Urine: 1.015 (ref 1.005–1.030)
pH: 6 (ref 5.0–8.0)

## 2016-11-26 LAB — LACTIC ACID, PLASMA
LACTIC ACID, VENOUS: 2.7 mmol/L — AB (ref 0.5–1.9)
Lactic Acid, Venous: 7.7 mmol/L (ref 0.5–1.9)

## 2016-11-26 LAB — ETHANOL: Alcohol, Ethyl (B): <5 mg/dL (ref <5)

## 2016-11-26 LAB — COMPREHENSIVE METABOLIC PANEL
ALBUMIN: 4.1 g/dL (ref 3.5–5.0)
ALK PHOS: 74 U/L (ref 38–126)
ALT: 75 U/L — AB (ref 17–63)
ANION GAP: 19 — AB (ref 5–15)
AST: 130 U/L — ABNORMAL HIGH (ref 15–41)
BUN: 12 mg/dL (ref 6–20)
CALCIUM: 9 mg/dL (ref 8.9–10.3)
CHLORIDE: 101 mmol/L (ref 101–111)
CO2: 18 mmol/L — AB (ref 22–32)
CREATININE: 2.05 mg/dL — AB (ref 0.61–1.24)
GFR calc non Af Amer: 40 mL/min — ABNORMAL LOW (ref >60)
GFR, EST AFRICAN AMERICAN: 47 mL/min — AB (ref >60)
GLUCOSE: 312 mg/dL — AB (ref 65–99)
Potassium: 4.3 mmol/L (ref 3.5–5.1)
SODIUM: 138 mmol/L (ref 135–145)
Total Bilirubin: 0.5 mg/dL (ref 0.3–1.2)
Total Protein: 7 g/dL (ref 6.5–8.1)

## 2016-11-26 LAB — RAPID URINE DRUG SCREEN, HOSP PERFORMED
AMPHETAMINES: NOT DETECTED
BARBITURATES: NOT DETECTED
Benzodiazepines: POSITIVE — AB
Cocaine: NOT DETECTED
Opiates: NOT DETECTED
TETRAHYDROCANNABINOL: POSITIVE — AB

## 2016-11-26 LAB — SALICYLATE LEVEL

## 2016-11-26 LAB — ACETAMINOPHEN LEVEL

## 2016-11-26 LAB — CK: CK TOTAL: 78 U/L (ref 49–397)

## 2016-11-26 LAB — I-STAT CG4 LACTIC ACID, ED: Lactic Acid, Venous: 4.88 mmol/L (ref 0.5–1.9)

## 2016-11-26 LAB — GLUCOSE, CAPILLARY: Glucose-Capillary: 76 mg/dL (ref 65–99)

## 2016-11-26 MED ORDER — HEPARIN SODIUM (PORCINE) 5000 UNIT/ML IJ SOLN
5000.0000 [IU] | Freq: Three times a day (TID) | INTRAMUSCULAR | Status: DC
  Administered 2016-11-26 – 2016-11-29 (×7): 5000 [IU] via SUBCUTANEOUS
  Filled 2016-11-26 (×7): qty 1

## 2016-11-26 MED ORDER — FAMOTIDINE IN NACL 20-0.9 MG/50ML-% IV SOLN
20.0000 mg | Freq: Two times a day (BID) | INTRAVENOUS | Status: DC
  Administered 2016-11-26 – 2016-11-29 (×6): 20 mg via INTRAVENOUS
  Filled 2016-11-26 (×6): qty 50

## 2016-11-26 MED ORDER — INSULIN ASPART 100 UNIT/ML ~~LOC~~ SOLN: 0.0000 [IU] | Freq: Every day | SUBCUTANEOUS | Status: DC

## 2016-11-26 MED ORDER — TRAMADOL HCL 50 MG PO TABS: 50.0000 mg | ORAL_TABLET | Freq: Four times a day (QID) | ORAL | Status: DC | PRN

## 2016-11-26 MED ORDER — ONDANSETRON HCL 4 MG/2ML IJ SOLN
4.0000 mg | Freq: Four times a day (QID) | INTRAMUSCULAR | Status: DC | PRN
  Administered 2016-11-26: 4 mg via INTRAVENOUS
  Filled 2016-11-26: qty 2

## 2016-11-26 MED ORDER — TRAMADOL HCL 50 MG PO TABS
50.0000 mg | ORAL_TABLET | Freq: Four times a day (QID) | ORAL | Status: DC | PRN
Start: 2016-11-26 — End: 2016-11-29
  Administered 2016-11-27: 100 mg via ORAL
  Administered 2016-11-27: 50 mg via ORAL
  Administered 2016-11-27 – 2016-11-29 (×3): 100 mg via ORAL
  Filled 2016-11-26: qty 1
  Filled 2016-11-26 (×4): qty 2

## 2016-11-26 MED ORDER — INSULIN ASPART 100 UNIT/ML ~~LOC~~ SOLN: 0.0000 [IU] | Freq: Three times a day (TID) | SUBCUTANEOUS | Status: DC

## 2016-11-26 MED ORDER — SODIUM CHLORIDE 0.9 % IV SOLN
3.0000 g | Freq: Once | INTRAVENOUS | Status: AC
  Administered 2016-11-26: 3 g via INTRAVENOUS
  Filled 2016-11-26: qty 3

## 2016-11-26 MED ORDER — ACETAMINOPHEN 325 MG PO TABS
650.0000 mg | ORAL_TABLET | Freq: Four times a day (QID) | ORAL | Status: DC | PRN
  Administered 2016-11-26: 650 mg via ORAL
  Filled 2016-11-26: qty 2

## 2016-11-26 MED ORDER — SODIUM CHLORIDE 0.9 % IV SOLN
INTRAVENOUS | Status: AC
  Administered 2016-11-26: 22:00:00 via INTRAVENOUS

## 2016-11-26 MED ORDER — LORAZEPAM 2 MG/ML IJ SOLN: 2.0000 mg | Freq: Once | INTRAMUSCULAR | Status: DC | PRN

## 2016-11-26 MED ORDER — LORAZEPAM 0.5 MG PO TABS
0.5000 mg | ORAL_TABLET | Freq: Four times a day (QID) | ORAL | Status: DC | PRN
  Filled 2016-11-26: qty 1

## 2016-11-26 MED ORDER — SODIUM CHLORIDE 0.9 % IV BOLUS (SEPSIS)
3000.0000 mL | Freq: Once | INTRAVENOUS | Status: AC
Start: 2016-11-26 — End: 2016-11-26
  Administered 2016-11-26: 3000 mL via INTRAVENOUS

## 2016-11-26 MED ORDER — PROMETHAZINE HCL 25 MG/ML IJ SOLN: 12.5000 mg | Freq: Four times a day (QID) | INTRAMUSCULAR | Status: DC | PRN

## 2016-11-26 NOTE — ED Notes (Signed)
[facility] on [facility] at this time.

## 2016-11-26 NOTE — H&P (Addendum)
History and Physical    MALE MINISH ZOX:[id number] DOB: 11-16-1980 DOA: 11/26/2016  PCP: Assunta Found, MD   Patient coming from: Home  Chief Complaint: Poorly responsive, pale  HPI: Brad Ramirez is a 36 y.o. male with medical history significant for polysubstance abuse, now presenting to the emergency department after being found poorly responsive and pale by a neighbor. Patient reports that he had just recovered from a brief illness marked by low grade fevers, lower abdominal discomfort, vomiting, and diarrhea. He had been ill with these symptoms for a couple days, but noted that they had seemed to resolve by yesterday. He reports being in his usual state this morning aside from a cough, exertional dyspnea, and chest congestion. He describes doing some chores about the house, then waking up in the ambulance. Patient denies any headache, change in vision or hearing, or focal numbness or weakness. He denies any recent alcohol or illicit substance use. EMS reports finding the patient poorly responsive and pale. They administered 4 mg of Narcan with no appreciable effect. He became more alert while en route. Only complaint by the time of arrival is dyspnea and cough.  ED Course: Upon arrival to the ED, patient is found to be afebrile, saturating 87% on room air, tachypneic in the mid 30s, mildly tachycardic, and with stable blood pressure. Chest x-ray demonstrates a large left basilar consolidation suggestive of pneumonia, possibly aspiration. Chemistry panels notable for serum creatinine of 2.05, up from 0.9 in November 2017. AST and ALT are mildly elevated with normal bilirubin, and serum glucose is 312. CBC is notable for a leukocytosis to 21,600 and UDS is positive for benzodiazepines and THC. Salicylates and acetaminophen levels are undetectable. Blood cultures were obtained, 30 cc/kg NS bolus was given, and the patient was started on empiric Unasyn for a suspected aspiration pneumonia. Oxygen  saturation normalized with 2 L/m via nasal cannula and the patient became increasingly more alert in the ED. He remained hemodynamically stable and will be admitted to the telemetry unit for ongoing evaluation and management of sepsis secondary to pneumonia.   Review of Systems:  All other systems reviewed and apart from HPI, are negative.  Past Medical History:  Diagnosis Date  . Opioid abuse     Past Surgical History:  Procedure Laterality Date  . CHOLECYSTECTOMY N/A 05/28/2016   Procedure: LAPAROSCOPIC CHOLECYSTECTOMY;  Surgeon: Harriette Bouillon, MD;  Location[facility];  Service: General;  Laterality: N/A;  . ERCP N/A 05/27/2016   Procedure: ENDOSCOPIC RETROGRADE CHOLANGIOPANCREATOGRAPHY (ERCP);  Surgeon: Dorena Cookey, MD;  Location: [facility] ENDOSCOPY;  Service: Endoscopy;  Laterality: N/A;  . TYMPANOSTOMY TUBE PLACEMENT Bilateral 1980s     reports that he has been smoking Cigarettes.  He has a 8.50 pack-year smoking history. He has never used smokeless tobacco. He reports that he drinks alcohol. He reports that he does not use drugs.  No Known Allergies  Family History  Problem Relation Age of Onset  . Appendicitis Father   . Liver disease Neg Hx      Prior to Admission medications   Medication Sig Start Date End Date Taking? Authorizing Provider  acetaminophen (TYLENOL) 325 MG tablet You can take 2 tablets every 4 hours as needed. Tylenol(acetaminophen) is in your prescription medication. He cannot take more than 4000 mg per day. 05/29/16   Sherrie George, PA-C  ibuprofen (MOTRIN IB) 200 MG tablet You can take 2-3 tablets every 6 hours as needed for pain.  I would use this first.  Use prescribed pain medicine with narcotic as second line treatment. 05/29/16   Sherrie George, PA-C    Physical Exam: Vitals:   11/26/16 1754 11/26/16 1756 11/26/16 1830 11/26/16 1832  BP: 114/79 114/79 120/78   Pulse: (!) 109 (!) 110 (!) 108   Resp: (!) 35 (!) 35 (!) 23   Temp:  97.6 F (36.4 C)   98 F (36.7 C)  TempSrc:  Oral  Rectal  SpO2: (!) 87% (!) 88% (!) 88%       Constitutional: mild tachypnea, occasional cough. No pallor, no diaphoresis.  Eyes: PERTLA, lids and conjunctivae normal ENMT: Mucous membranes are dry. Posterior pharynx clear of any exudate or lesions.   Neck: normal, supple, no masses, no thyromegaly Respiratory: Mild tachypnea and occasional cough. Coarse rhonchi at left base and mid-lung zones. No pallor or cyanoosis. No accessory muscle use.  Cardiovascular: Rate ~110 and regular. No significant JVD. Abdomen: No distension, no tenderness, no masses palpated. Bowel sounds normal.  Musculoskeletal: no clubbing / cyanosis. No joint deformity upper and lower extremities. Normal muscle tone.  Skin: no significant rashes, lesions, ulcers. Warm, dry, well-perfused. Neurologic: CN 2-12 grossly intact. Sensation intact, DTR normal. Strength 5/5 in all 4 limbs.  Psychiatric: Alert and oriented x 3. Pleasant and cooperative.    Labs on Admission: I have personally reviewed following labs and imaging studies  CBC:  Recent Labs Lab 11/26/16 1754  WBC 21.6*  NEUTROABS 10.3*  HGB 14.9  HCT 45.4  MCV 90.1  PLT 255   Basic Metabolic Panel:  Recent Labs Lab 11/26/16 1754  NA 138  K 4.3  CL 101  CO2 18*  GLUCOSE 312*  BUN 12  CREATININE 2.05*  CALCIUM 9.0   GFR: CrCl cannot be calculated (Unknown ideal weight.). Liver Function Tests:  Recent Labs Lab 11/26/16 1754  AST 130*  ALT 75*  ALKPHOS 74  BILITOT 0.5  PROT 7.0  ALBUMIN 4.1   No results for input(s): LIPASE, AMYLASE in the last 168 hours. No results for input(s): AMMONIA in the last 168 hours. Coagulation Profile: No results for input(s): INR, PROTIME in the last 168 hours. Cardiac Enzymes:  Recent Labs Lab 11/26/16 1754  CKTOTAL 78   BNP (last 3 results) No results for input(s): PROBNP in the last 8760 hours. HbA1C: No results for input(s): HGBA1C in the last 72  hours. CBG: No results for input(s): GLUCAP in the last 168 hours. Lipid Profile: No results for input(s): CHOL, HDL, LDLCALC, TRIG, CHOLHDL, LDLDIRECT in the last 72 hours. Thyroid Function Tests: No results for input(s): TSH, T4TOTAL, FREET4, T3FREE, THYROIDAB in the last 72 hours. Anemia Panel: No results for input(s): VITAMINB12, FOLATE, FERRITIN, TIBC, IRON, RETICCTPCT in the last 72 hours. Urine analysis:    Component Value Date/Time   COLORURINE YELLOW 05/25/2016 2058   APPEARANCEUR CLEAR 05/25/2016 2058   LABSPEC 1.010 05/25/2016 2058   PHURINE 7.0 05/25/2016 2058   GLUCOSEU NEGATIVE 05/25/2016 2058   HGBUR NEGATIVE 05/25/2016 2058   BILIRUBINUR LARGE (A) 05/25/2016 2058   KETONESUR NEGATIVE 05/25/2016 2058   PROTEINUR NEGATIVE 05/25/2016 2058   NITRITE NEGATIVE 05/25/2016 2058   LEUKOCYTESUR NEGATIVE 05/25/2016 2058   Sepsis Labs: @LABRCNTIP (procalcitonin:4,lacticidven:4) )No results found for this or any previous visit (from the past 240 hour(s)).   Radiological Exams on Admission: Dg Chest Port 1 View  Result Date: 11/26/2016 CLINICAL DATA:  Altered mental status and tachypnea EXAM: PORTABLE CHEST 1 VIEW COMPARISON:  Chest radiograph 11/28/2014 FINDINGS: Cardiac silhouette  is within normal limits allowing for AP technique. There is a large area of opacification at the lateral left lung base. The right lung is clear. No pneumothorax. IMPRESSION: Large area of left basilar consolidation. This could indicate pneumonia or aspiration pneumonitis. Electronically Signed   By: Deatra RobinsonKevin  Herman M.D.   On: 11/26/2016 18:40    EKG: Not performed.   Assessment/Plan  1. Sepsis secondary to pneumonia; acute hypoxic respiratory failure  - Pt presents after being found poorly responsive and pale by neighbor; found to be hypoxic with leukocytosis, elevated lactate, and LLL PNA - Given radiographic appearance and hx of GI illness with vomiting just prior to onset of the respiratory sxs,  aspiration PNA is suspected  - Blood cultures were obtained in ED, 30 cc/kg NS bolus given, and empiric Unasyn initiated  - Plan to check sputum cultures, continue Unasyn pending culture data  - Continue supplemental O2 prn    2. Acute kidney injury  - SCr is 2.05 on admission, up from apparent baseline of 0.9  - Likely prerenal azotemia in setting of vomiting and sepsis  - He was fluid-resuscitated in ED with 30 cc/kg NS - Continue IVF, avoid nephrotoxins, repeat chem panel in am    3. Elevated LFT's  - AST 130 and ALT 75 with normal bilirubin on admission - APAP level <10, EtOH level pending, likely secondary to sepsis  - Transaminases and bilirubin had been elevated on prior labs, but this was in setting of choledocholithiasis that was treated with ERCP with sphincterotomy and cholecystectomy  - Continue IVF hydration and repeat CMP in am    4. Polysubstance abuse  - Hx of opiate abuse, reportedly in remisson - UDS positive for benzodiazepines and THC; no prescription for benzo, pt not forthcoming about this on admission - Will monitor for withdrawal, use prn benzodiazepine to avoid seizure   5. Hyperglycemia  - Serum glucose 312 on admission - Secondary to sepsis vs DM  - Check A1c, CBG with meals and qHS  - Start low-intensity SSI    DVT prophylaxis: sq heparin  Code Status: Full  Family Communication: Wife and father updated at bedside with patient's permission Disposition Plan: Admit to telemetry Consults called: None Admission status: Inpatient    Briscoe Deutscherimothy S Jaeceon Michelin, MD [facility] Pager [phone]  If 7PM-7AM, please contact night-coverage [email or web address] Password [email or web address]  11/26/2016, 7:37 PM

## 2016-11-26 NOTE — ED Notes (Signed)
Date and time results received: 11/26/16  1934   Test: lactic acid  Critical Value: 4.88  Name of Provider Notified: Jacobawitz  Orders Received? Or Actions Taken?:no new orders at this time.

## 2016-11-26 NOTE — ED Provider Notes (Addendum)
[facility]-EMERGENCY DEPT Provider Note   CSN: [id number] Arrival date & time: 11/26/16  1745     History   Chief Complaint Chief Complaint  Patient presents with  . Altered Mental Status    HPI Brad Ramirez is a 36 y.o. male.Caveat altered mental status. History is obtained from patient, patient's wife and from paramedics  HPI Patient was found in his garage by a neighbor unresponsive. EMS arrived to find patient with pinpoint pupils, agonal respirations. Patient's wife reports that neighbor found him "foaming at the mouth." Treated patient with Narcan 2 mg intranasally and 2 mg intravenously, without improvement of mental status. Patient a dmits to using 2 or 3 Percocet tablets earlier today. He and his wife also reports cough and periumbilical abdominal pain 2 days ago which had improved. Patient arrived responsive to tactile stimulus. Not following simple commands. He had no recall of being unconscious. His wife reports that she texted him approximately 4 PM today and texts seem normal Past Medical History:  Diagnosis Date  . Opioid abuse     Patient Active Problem List   Diagnosis Date Noted  . Biliary obstruction 05/26/2016  . Leukocytosis 05/26/2016  . Hyperbilirubinemia 05/26/2016  . Elevated LFTs 05/26/2016  . Nausea & vomiting 05/26/2016  . GERD (gastroesophageal reflux disease) 05/26/2016    Past Surgical History:  Procedure Laterality Date  . CHOLECYSTECTOMY N/A 05/28/2016   Procedure: LAPAROSCOPIC CHOLECYSTECTOMY;  Surgeon: Harriette Bouillon, MD;  Location: [facility] OR;  Service: General;  Laterality: N/A;  . ERCP N/A 05/27/2016   Procedure: ENDOSCOPIC RETROGRADE CHOLANGIOPANCREATOGRAPHY (ERCP);  Surgeon: Dorena Cookey, MD;  Location: [facility] ENDOSCOPY;  Service: Endoscopy;  Laterality: N/A;  . TYMPANOSTOMY TUBE PLACEMENT Bilateral 1980s       Home Medications    Prior to Admission medications   Medication Sig Start Date End Date Taking? Authorizing Provider  acetaminophen  (TYLENOL) 325 MG tablet You can take 2 tablets every 4 hours as needed. Tylenol(acetaminophen) is in your prescription medication. He cannot take more than 4000 mg per day. 05/29/16   Sherrie George, PA-C  ibuprofen (MOTRIN IB) 200 MG tablet You can take 2-3 tablets every 6 hours as needed for pain.  I would use this first. Use prescribed pain medicine with narcotic as second line treatment. 05/29/16   Sherrie George, PA-C    Family History Family History  Problem Relation Age of Onset  . Appendicitis Father   . Liver disease Neg Hx     Social History Social History  Substance Use Topics  . Smoking status: Current Every Day Smoker    Packs/day: 0.50    Years: 17.00    Types: Cigarettes  . Smokeless tobacco: Never Used  . Alcohol use Yes     Comment: 05/26/2016 "might have 1/2 bottle of wine/month; if that"     Allergies   Patient has no known allergies.   Review of Systems Review of Systems  Unable to perform ROS: Mental status change     Physical Exam Updated Vital Signs BP 114/79 (BP Location: Left Arm)   Pulse (!) 110   Temp 97.6 F (36.4 C) (Oral)   Resp (!) 35   SpO2 (!) 88%   Physical Exam  Constitutional: He appears well-developed and well-nourished. He appears distressed.  Ill-appearing  HENT:  Head: Normocephalic and atraumatic.  Eyes: Conjunctivae are normal. Pupils are equal, round, and reactive to light.  Pupils 3-4 mm reactive to light bilaterally  Neck: Neck supple. No tracheal deviation present.  No thyromegaly present.  Cardiovascular: Regular rhythm.   No murmur heard. Tachycardic  Pulmonary/Chest: Effort normal.  Tachypnea, diffuse rhonchi  Abdominal: Soft. Bowel sounds are normal. He exhibits no distension. There is no tenderness.  Musculoskeletal: Normal range of motion. He exhibits no edema or tenderness.  Neurological: Coordination normal.  Somnolent arousable to tactile stimuli. Does not follow simple commands moves all extremities   Skin: Skin is warm and dry. No rash noted.  Psychiatric: He has a normal mood and affect.  Nursing note and vitals reviewed.    ED Treatments / Results  Labs (all labs ordered are listed, but only abnormal results are displayed) Labs Reviewed  RAPID URINE DRUG SCREEN, HOSP PERFORMED - Abnormal; Notable for the following:       Result Value   Benzodiazepines POSITIVE (*)    Tetrahydrocannabinol POSITIVE (*)    All other components within normal limits  COMPREHENSIVE METABOLIC PANEL  CBC WITH DIFFERENTIAL/PLATELET  ACETAMINOPHEN LEVEL  SALICYLATE LEVEL  BLOOD GAS, ARTERIAL  CK   ED ECG REPORT   Date: 11/26/2016  Rate: 110  Rhythm: sinus tachycardia  QRS Axis: normal  Intervals: normal  ST/T Wave abnormalities: nonspecific T wave changes  Conduction Disutrbances:none  Narrative Interpretation:   Old EKG Reviewed: none available  I have personally reviewed the EKG tracing and agree with the computerized printout as noted. EKG  EKG Interpretation None     chest x-ray viewed by me  Radiology No results found.  Procedures Procedures (including critical care time)  Medications Ordered in ED Medications - No data to display   Initial Impression / Assessment and Plan / ED Course  I have reviewed the triage vital signs and the nursing notes.  Pertinent labs & imaging results that were available during my care of the patient were reviewed by me and considered in my medical decision making (see chart for details).     Approximately 5 minutes after arrival patient became more alert, speech slightly slurred. Follow simple commands, moves all extremities.  At 6:20 PM patient is alert Glasgow Coma Score 15. Moves all extremity as well cranial nerves II through XII grossly intact Code sepsis called based on Sirs criteria tachypnea, tachycardia, suspected source respiratory.  3 L IV normal saline bolus ordered by me,or approximately 30 mL/kg as patient with renal  failure as well as IV Unasyn to treat aspiration pneumonia  At 6:50 PM I don't feel the patient needs airway management as he is fully awake alert Glasgow Coma Score 15. talks in paragraphs talks in paragraphs  Sepsis - Repeat Assessment  Performed at:    730pm  Vitals     Blood pressure 120/78, pulse (!) 108, temperature 98 F (36.7 C), temperature source Rectal, resp. rate (!) 23, SpO2 (!) 88 %.  Heart:     Tachycardic  Lungs:    Rhonchi  Capillary Refill:   <2 sec  Peripheral Pulse:   Radial pulse palpable  Skin:     Normal Color Dr. Antionette CharPYD consulted for admission and saw patient in the emergency department.  Final Clinical Impressions(s) / ED Diagnoses  Diagnosis #1 septic shock #2 aspiration pneumonia #3 altered mental status #4 renal failure #5 hyperglycemia #6 polysubstance abuse CRITICAL CARE Performed by: Doug SouJACUBOWITZ,Miller [place]house Total critical care time: 45 minutes Critical care time was exclusive of separately billable procedures and treating other patients. Critical care was necessary to treat or prevent imminent or life-threatening deterioration. Critical care was time spent personally by me on the following  activities: development of treatment plan with patient and/or surrogate as well as nursing, discussions with consultants, evaluation of patient's response to treatment, examination of patient, obtaining history from patient or surrogate, ordering and performing treatments and interventions, ordering and review of laboratory studies, ordering and review of radiographic studies, pulse oximetry and re-evaluation of patient's condition. Final diagnoses:  None    New Prescriptions New Prescriptions   No medications on file     Doug Sou, MD 11/26/16 1610    Doug Sou, MD 11/26/16 Barry Brunner

## 2016-11-26 NOTE — ED Notes (Signed)
Per lab pt has had blood cultures drawn.

## 2016-11-26 NOTE — ED Triage Notes (Signed)
Per ems. Pt found by neighbor who called ems, pt was unresponsive. Pt arrived waking up to ED. Pt received 4 mg narcan in route with no response. Pt groggy. Pale.

## 2016-11-27 ENCOUNTER — Inpatient Hospital Stay (HOSPITAL_COMMUNITY): Payer: BLUE CROSS/BLUE SHIELD

## 2016-11-27 DIAGNOSIS — R739 Hyperglycemia, unspecified: Secondary | ICD-10-CM

## 2016-11-27 DIAGNOSIS — R1033 Periumbilical pain: Secondary | ICD-10-CM

## 2016-11-27 DIAGNOSIS — R7989 Other specified abnormal findings of blood chemistry: Secondary | ICD-10-CM

## 2016-11-27 DIAGNOSIS — A419 Sepsis, unspecified organism: Principal | ICD-10-CM

## 2016-11-27 DIAGNOSIS — N179 Acute kidney failure, unspecified: Secondary | ICD-10-CM

## 2016-11-27 DIAGNOSIS — J9601 Acute respiratory failure with hypoxia: Secondary | ICD-10-CM

## 2016-11-27 DIAGNOSIS — J189 Pneumonia, unspecified organism: Secondary | ICD-10-CM

## 2016-11-27 LAB — CBC WITH DIFFERENTIAL/PLATELET
BASOS ABS: 0 10*3/uL (ref 0.0–0.1)
BASOS PCT: 0 %
EOS ABS: 0 10*3/uL (ref 0.0–0.7)
Eosinophils Relative: 0 %
HCT: 36.7 % — ABNORMAL LOW (ref 39.0–52.0)
Hemoglobin: 12.5 g/dL — ABNORMAL LOW (ref 13.0–17.0)
Lymphocytes Relative: 19 %
Lymphs Abs: 2.9 10*3/uL (ref 0.7–4.0)
MCH: 29.3 pg (ref 26.0–34.0)
MCHC: 34.1 g/dL (ref 30.0–36.0)
MCV: 86.2 fL (ref 78.0–100.0)
Monocytes Absolute: 0.7 10*3/uL (ref 0.1–1.0)
Monocytes Relative: 5 %
NEUTROS ABS: 12 10*3/uL — AB (ref 1.7–7.7)
NEUTROS PCT: 77 %
PLATELETS: 157 10*3/uL (ref 150–400)
RBC: 4.26 MIL/uL (ref 4.22–5.81)
RDW: 13.9 % (ref 11.5–15.5)
WBC: 15.7 10*3/uL — AB (ref 4.0–10.5)

## 2016-11-27 LAB — GLUCOSE, CAPILLARY
GLUCOSE-CAPILLARY: 86 mg/dL (ref 65–99)
GLUCOSE-CAPILLARY: 82 mg/dL (ref 65–99)
GLUCOSE-CAPILLARY: 95 mg/dL (ref 65–99)
Glucose-Capillary: 94 mg/dL (ref 65–99)

## 2016-11-27 LAB — COMPREHENSIVE METABOLIC PANEL
ALK PHOS: 50 U/L (ref 38–126)
ALT: 66 U/L — ABNORMAL HIGH (ref 17–63)
ANION GAP: 6 (ref 5–15)
AST: 69 U/L — ABNORMAL HIGH (ref 15–41)
Albumin: 3.3 g/dL — ABNORMAL LOW (ref 3.5–5.0)
BILIRUBIN TOTAL: 0.5 mg/dL (ref 0.3–1.2)
BUN: 15 mg/dL (ref 6–20)
CALCIUM: 7.5 mg/dL — AB (ref 8.9–10.3)
CO2: 22 mmol/L (ref 22–32)
Chloride: 108 mmol/L (ref 101–111)
Creatinine, Ser: 0.9 mg/dL (ref 0.61–1.24)
GFR calc Af Amer: >60 mL/min (ref >60)
Glucose, Bld: 120 mg/dL — ABNORMAL HIGH (ref 65–99)
Potassium: 3.9 mmol/L (ref 3.5–5.1)
SODIUM: 136 mmol/L (ref 135–145)
TOTAL PROTEIN: 5.6 g/dL — AB (ref 6.5–8.1)

## 2016-11-27 LAB — STREP PNEUMONIAE URINARY ANTIGEN: Strep Pneumo Urinary Antigen: NEGATIVE

## 2016-11-27 LAB — LIPASE, BLOOD: LIPASE: 27 U/L (ref 11–51)

## 2016-11-27 LAB — LACTIC ACID, PLASMA: LACTIC ACID, VENOUS: 1.3 mmol/L (ref 0.5–1.9)

## 2016-11-27 MED ORDER — SODIUM CHLORIDE 0.9 % IV SOLN
3.0000 g | Freq: Four times a day (QID) | INTRAVENOUS | Status: DC
  Administered 2016-11-27 – 2016-11-29 (×11): 3 g via INTRAVENOUS
  Filled 2016-11-27 (×16): qty 3

## 2016-11-27 MED ORDER — SODIUM CHLORIDE 0.9 % IV BOLUS (SEPSIS)
1000.0000 mL | Freq: Once | INTRAVENOUS | Status: AC
  Administered 2016-11-27: 1000 mL via INTRAVENOUS

## 2016-11-27 MED ORDER — GUAIFENESIN ER 600 MG PO TB12
1200.0000 mg | ORAL_TABLET | Freq: Two times a day (BID) | ORAL | Status: DC
  Administered 2016-11-27 – 2016-11-29 (×5): 1200 mg via ORAL
  Filled 2016-11-27 (×5): qty 2

## 2016-11-27 MED ORDER — IOPAMIDOL (ISOVUE-300) INJECTION 61%
INTRAVENOUS | Status: AC
  Administered 2016-11-27: 30 mL
  Filled 2016-11-27: qty 30

## 2016-11-27 MED ORDER — DEXTROSE 5 % IV SOLN
500.0000 mg | INTRAVENOUS | Status: DC
  Administered 2016-11-27 – 2016-11-29 (×3): 500 mg via INTRAVENOUS
  Filled 2016-11-27 (×4): qty 500

## 2016-11-27 MED ORDER — IOPAMIDOL (ISOVUE-300) INJECTION 61%
100.0000 mL | Freq: Once | INTRAVENOUS | Status: AC | PRN
  Administered 2016-11-27: 100 mL via INTRAVENOUS

## 2016-11-27 MED ORDER — AMPICILLIN-SULBACTAM SODIUM 3 (2-1) G IJ SOLR
INTRAMUSCULAR | Status: AC
  Filled 2016-11-27: qty 3

## 2016-11-27 MED ORDER — OXYCODONE HCL 5 MG PO TABS
5.0000 mg | ORAL_TABLET | Freq: Four times a day (QID) | ORAL | Status: DC | PRN
  Administered 2016-11-27 – 2016-11-29 (×4): 5 mg via ORAL
  Filled 2016-11-27 (×4): qty 1

## 2016-11-27 MED ORDER — MORPHINE SULFATE (PF) 4 MG/ML IV SOLN
4.0000 mg | Freq: Once | INTRAVENOUS | Status: AC
  Administered 2016-11-27: 4 mg via INTRAVENOUS
  Filled 2016-11-27: qty 1

## 2016-11-27 MED ORDER — SODIUM CHLORIDE 0.9 % IV SOLN
INTRAVENOUS | Status: DC
  Administered 2016-11-27 – 2016-11-29 (×3): via INTRAVENOUS

## 2016-11-27 NOTE — Progress Notes (Signed)
Patient reported that on Saturday May 5th he used a professional grade weed killer on his yard, [employer] [place], which can cause respiratory issues. He noticed that his chest and lungs were tight on Sunday and that's the day he started to feel poorly. Symptoms included nausea, vomiting, chest tightness and some diarrhea.

## 2016-11-27 NOTE — Progress Notes (Signed)
Patient c/o right sided abdominal pain unrelieved by Tylenol. MD paged and orders given for Ultram.

## 2016-11-27 NOTE — Progress Notes (Signed)
Pharmacy Antibiotic Note  Rafael BihariJacob R Orr is a 36 y.o. male admitted on 11/26/2016 with aspiration pneumonia.  Pharmacy has been consulted for Unasyn dosing. Pt with acute renal injury due to prerenal azotemia. Follow up BMP to be drawn in the am.   Plan: Unasyn 3g IV Q6h Follow up cultures and renal function  Height: 6\' 2"  (188 cm) Weight: 242 lb 15.2 oz (110.2 kg) IBW/kg (Calculated) : 82.2  Temp (24hrs), Avg:97.7 F (36.5 C), Min:97.6 F (36.4 C), Max:98 F (36.7 C)   Recent Labs Lab 11/26/16 1754 11/26/16 1928 11/26/16 2319  WBC 21.6*  --   --   CREATININE 2.05*  --   --   LATICACIDVEN 7.7* 4.88* 2.7*    Estimated Creatinine Clearance: 66.4 mL/min (A) (by C-G formula based on SCr of 2.05 mg/dL (H)).    No Known Allergies  Antimicrobials this admission: Unasyn 5/10 >>    Dose adjustments this admission: none  Microbiology results: 5/10 BCx x 2: pending  Thank you for allowing pharmacy to be a part of this patient's care.  Lenore MannerHolcombe, Aharon Carriere [place] 11/27/2016 1:10 AM

## 2016-11-27 NOTE — Progress Notes (Signed)
PROGRESS NOTE                                                                                                                                                                                                             Patient Demographics:    Brad Ramirez, is a 36 y.o. male, DOB - 1981-04-19, ZOX:[id number]  Admit date - 11/26/2016   Admitting Physician Briscoe Deutscher, MD  Outpatient Primary MD for the patient is Assunta Found, MD  LOS - 1    Chief Complaint  Patient presents with  . Altered Mental Status       Brief Narrative   36 year old male presents with lethargy, workup significant for sepsis secondary to pneumonia.   Subjective:    Brad Ramirez today has, No headache, No chest pain, Complains of abdominal pain, cough, nonproductive, no nausea or vomiting.  Assessment  & Plan :    Principal Problem:   Sepsis due to pneumonia [facility]) Active Problems:   Elevated LFTs   Polysubstance abuse   AKI (acute kidney injury) (HCC)   Acute respiratory failure with hypoxia (HCC)   Hyperglycemia   Sepsis secondary to community-acquired pneumonia - Presents with tachycardia, tachypnea, leukocytosis and elevated lactic acid, chest x-ray with evidence of left lower lobe pneumonia, and ports vomiting befor 3 days, so aspiration is suspected, so we'll continue with IV Unasyn, as well as azithromycin to cover for atypical. - Follow on blood cultures and sputum cultures  Hypoxic respiratory failure - Oxygen saturating 87 % on room air on presentation, this is secondary to pneumonia, continue with oxygen and wean as tolerated  Abd pain, nausea and vomiting - KUB with no acute findings, having some tenderness in periumbilical area, will obtain lipase,I will  obtain CT abdomen pelvis with contrast  AKI - Retaining 2.05 on admission, back to baseline today at 0.9, this is most likely due to volume depletion and dehydration, continue with IV  fluids  Elevated LFTs - Continue to follow, follow on CT abdomen  Polysubstance abuse - Urine drug screen significant for benzodiazepine and THC.  Hyperglycemia - Glucose level was 312 on presentation, most likely stress related, so far CBG has been controlled, continue with insulin sliding scale, will follow on hemoglobin A1c  Code Status : Full  Family Communication  : mother at bedside  Disposition Plan  : home when stable  Consults  :  none  Procedures  : None  DVT Prophylaxis  :   Heparin - SCDs   Lab Results  Component Value Date   PLT 157 11/27/2016    Antibiotics  :    Anti-infectives    Start     Dose/Rate Route Frequency Ordered Stop   11/27/16 1100  azithromycin (ZITHROMAX) 500 mg in dextrose 5 % 250 mL IVPB     500 mg 250 mL/hr over 60 Minutes Intravenous Every 24 hours 11/27/16 0939     11/27/16 0200  Ampicillin-Sulbactam (UNASYN) 3 g in sodium chloride 0.9 % 100 mL IVPB     3 g 200 mL/hr over 30 Minutes Intravenous Every 6 hours 11/27/16 0110     11/26/16 1900  Ampicillin-Sulbactam (UNASYN) 3 g in sodium chloride 0.9 % 100 mL IVPB     3 g 200 mL/hr over 30 Minutes Intravenous  Once 11/26/16 1849 11/26/16 2007        Objective:   Vitals:   11/26/16 1930 11/26/16 2000 11/26/16 2058 11/27/16 0656  BP: 112/80 119/71 107/66 (!) 99/53  Pulse: (!) 103 94 95 64  Resp: (!) 24 (!) 26 20 (!) 21  Temp:   97.6 F (36.4 C) 98.2 F (36.8 C)  TempSrc:   Oral Oral  SpO2: 98% 97% 97% 99%  Weight:   110.2 kg (242 lb 15.2 oz)   Height:   6\' 2"  (1.88 m)     Wt Readings from Last 3 Encounters:  11/26/16 110.2 kg (242 lb 15.2 oz)  05/28/16 99.2 kg (218 lb 11.2 oz)  11/28/14 104.3 kg (230 lb)     Intake/Output Summary (Last 24 hours) at 11/27/16 1102 Last data filed at 11/27/16 0543  Gross per 24 hour  Intake          1172.92 ml  Output                4 ml  Net          1168.92 ml     Physical Exam  Awake Alert, Oriented X 3, No new F.N deficits,  Normal affect Supple Neck,No JVD, Symmetrical Chest wall movement, Good air movement bilaterally, Left lung rales RRR,No Gallops,Rubs or new Murmurs, No Parasternal Heave +ve B.Sounds, Abd Soft, mild periumbilical tenderness, No rebound - guarding or rigidity. No Cyanosis, Clubbing or edema, No new Rash or bruise      Data Review:    CBC  Recent Labs Lab 11/26/16 1754 11/27/16 0221  WBC 21.6* 15.7*  HGB 14.9 12.5*  HCT 45.4 36.7*  PLT 255 157  MCV 90.1 86.2  MCH 29.6 29.3  MCHC 32.8 34.1  RDW 14.0 13.9  LYMPHSABS 10.4* 2.9  MONOABS 0.5 0.7  EOSABS 0.4 0.0  BASOSABS 0.1 0.0    Chemistries   Recent Labs Lab 11/26/16 1754 11/27/16 0221  NA 138 136  K 4.3 3.9  CL 101 108  CO2 18* 22  GLUCOSE 312* 120*  BUN 12 15  CREATININE 2.05* 0.90  CALCIUM 9.0 7.5*  AST 130* 69*  ALT 75* 66*  ALKPHOS 74 50  BILITOT 0.5 0.5   ------------------------------------------------------------------------------------------------------------------ No results for input(s): CHOL, HDL, LDLCALC, TRIG, CHOLHDL, LDLDIRECT in the last 72 hours.  No results found for: HGBA1C ------------------------------------------------------------------------------------------------------------------ No results for input(s): TSH, T4TOTAL, T3FREE, THYROIDAB in the last 72 hours.  Invalid input(s): FREET3 ------------------------------------------------------------------------------------------------------------------ No results for input(s): VITAMINB12, FOLATE, FERRITIN, TIBC,  IRON, RETICCTPCT in the last 72 hours.  Coagulation profile No results for input(s): INR, PROTIME in the last 168 hours.  No results for input(s): DDIMER in the last 72 hours.  Cardiac Enzymes No results for input(s): CKMB, TROPONINI, MYOGLOBIN in the last 168 hours.  Invalid input(s): CK ------------------------------------------------------------------------------------------------------------------ No results found  for: BNP  Inpatient Medications  Scheduled Meds: . heparin  5,000 Units Subcutaneous Q8H  . insulin aspart  0-5 Units Subcutaneous QHS  . insulin aspart  0-9 Units Subcutaneous TID WC   Continuous Infusions: . sodium chloride    . ampicillin-sulbactam (UNASYN) IV Stopped (11/27/16 0900)  . azithromycin    . famotidine (PEPCID) IV 20 mg (11/27/16 1037)  . sodium chloride 1,000 mL (11/27/16 1018)   PRN Meds:.acetaminophen, LORazepam, LORazepam, ondansetron (ZOFRAN) IV, promethazine, traMADol  Micro Results Recent Results (from the past 240 hour(s))  Blood Culture (routine x 2)     Status: None (Preliminary result)   Collection Time: 11/26/16  6:59 PM  Result Value Ref Range Status   Specimen Description BLOOD RIGHT FOREARM  Final   Special Requests   Final    BOTTLES DRAWN AEROBIC AND ANAEROBIC Blood Culture results may not be optimal due to an inadequate volume of blood received in culture bottles   Culture NO GROWTH < 24 HOURS  Final   Report Status PENDING  Incomplete  Blood Culture (routine x 2)     Status: None (Preliminary result)   Collection Time: 11/26/16  7:10 PM  Result Value Ref Range Status   Specimen Description BLOOD RIGHT HAND  Final   Special Requests   Final    BOTTLES DRAWN AEROBIC AND ANAEROBIC Blood Culture results may not be optimal due to an inadequate volume of blood received in culture bottles   Culture NO GROWTH < 24 HOURS  Final   Report Status PENDING  Incomplete    Radiology Reports Dg Chest Port 1 View  Result Date: 11/26/2016 CLINICAL DATA:  Altered mental status and tachypnea EXAM: PORTABLE CHEST 1 VIEW COMPARISON:  Chest radiograph 11/28/2014 FINDINGS: Cardiac silhouette is within normal limits allowing for AP technique. There is a large area of opacification at the lateral left lung base. The right lung is clear. No pneumothorax. IMPRESSION: Large area of left basilar consolidation. This could indicate pneumonia or aspiration pneumonitis.  Electronically Signed   By: Deatra Robinson M.D.   On: 11/26/2016 18:40   Dg Abd Portable 1v  Result Date: 11/27/2016 CLINICAL DATA:  36 y/o M; right lower abdominal pain. History of ERCP and cholecystectomy in 2017. EXAM: PORTABLE ABDOMEN - 1 VIEW COMPARISON:  None. FINDINGS: The bowel gas pattern is normal. No radio-opaque calculi or other significant radiographic abnormality are seen. Right upper quadrant cholecystectomy clips. IMPRESSION: Normal bowel gas pattern. Electronically Signed   By: Mitzi Hansen M.D.   On: 11/27/2016 00:41     Brydon Spahr M.D on 11/27/2016 at 11:02 AM  Between 7am to 7pm - Pager - [phone]  After 7pm go to [email or web address] - password [email or web address]  [facility] -  Office  [phone]

## 2016-11-28 LAB — COMPREHENSIVE METABOLIC PANEL
ALBUMIN: 3.5 g/dL (ref 3.5–5.0)
ALK PHOS: 46 U/L (ref 38–126)
ALT: 50 U/L (ref 17–63)
AST: 31 U/L (ref 15–41)
Anion gap: 5 (ref 5–15)
BUN: 8 mg/dL (ref 6–20)
CALCIUM: 8.4 mg/dL — AB (ref 8.9–10.3)
CHLORIDE: 106 mmol/L (ref 101–111)
CO2: 27 mmol/L (ref 22–32)
CREATININE: 0.82 mg/dL (ref 0.61–1.24)
GFR calc non Af Amer: >60 mL/min (ref >60)
GLUCOSE: 99 mg/dL (ref 65–99)
Potassium: 3.9 mmol/L (ref 3.5–5.1)
Sodium: 138 mmol/L (ref 135–145)
Total Bilirubin: 0.6 mg/dL (ref 0.3–1.2)
Total Protein: 5.9 g/dL — ABNORMAL LOW (ref 6.5–8.1)

## 2016-11-28 LAB — CBC
HCT: 34.7 % — ABNORMAL LOW (ref 39.0–52.0)
HEMOGLOBIN: 11.9 g/dL — AB (ref 13.0–17.0)
MCH: 29.3 pg (ref 26.0–34.0)
MCHC: 34.3 g/dL (ref 30.0–36.0)
MCV: 85.5 fL (ref 78.0–100.0)
PLATELETS: 167 10*3/uL (ref 150–400)
RBC: 4.06 MIL/uL — AB (ref 4.22–5.81)
RDW: 13.9 % (ref 11.5–15.5)
WBC: 9 10*3/uL (ref 4.0–10.5)

## 2016-11-28 LAB — HEMOGLOBIN A1C
HEMOGLOBIN A1C: 5.2 % (ref 4.8–5.6)
MEAN PLASMA GLUCOSE: 103 mg/dL

## 2016-11-28 LAB — GLUCOSE, CAPILLARY
Glucose-Capillary: 82 mg/dL (ref 65–99)
Glucose-Capillary: 90 mg/dL (ref 65–99)
Glucose-Capillary: 115 mg/dL — ABNORMAL HIGH (ref 65–99)
Glucose-Capillary: 95 mg/dL (ref 65–99)

## 2016-11-28 LAB — HIV ANTIBODY (ROUTINE TESTING W REFLEX): HIV Screen 4th Generation wRfx: NONREACTIVE

## 2016-11-28 NOTE — Progress Notes (Signed)
PROGRESS NOTE                                                                                                                                                                                                             Patient Demographics:    Brad Ramirez, is a 36 y.o. male, DOB - 1980/09/19, WUJ:[id number]  Admit date - 11/26/2016   Admitting Physician Briscoe Deutscher, MD  Outpatient Primary MD for the patient is Assunta Found, MD  LOS - 2    Chief Complaint  Patient presents with  . Altered Mental Status       Brief Narrative   36 year old male presents with lethargy, workup significant for sepsis secondary to pneumonia.   Subjective:    Brad Ramirez today has, No headache, No chest pain, Reports is feeling much better today, no further abdominal pain, tolerating diet, still with cough, nonproductive .  Assessment  & Plan :    Principal Problem:   Sepsis due to pneumonia [facility]) Active Problems:   Elevated LFTs   Polysubstance abuse   AKI (acute kidney injury) (HCC)   Acute respiratory failure with hypoxia (HCC)   Hyperglycemia   Sepsis secondary to community-acquired pneumonia - Presents with tachycardia, tachypnea, leukocytosis and elevated lactic acid, chest x-ray with evidence of left lower lobe pneumonia, and ports vomiting befor 3 days, so aspiration is suspected,  - Physiology of sepsis has resolved  - Continue with IV Unasyn to cover for possible aspiration, continue azithromycin for atypicals, hopefully can be transitioned to oral antibiotics in 1-2 days . - Blood culture with no growth to date, sputum culture still pending  Hypoxic respiratory failure - Oxygen saturating 87 % on room air on presentation, this is secondary to pneumonia, resolved, tolerating room air this a.m.   Abd pain, nausea and vomiting - KUB with no acute findings, having some tenderness in periumbilical area, CT abdomen pelvis with no acute findings,  lipase within normal limits  AKI - Retaining 2.05 on admission, back to baseline , most likely due to volume depletion and dehydration, continue with IV fluids  Elevated LFTs - No acute finding in CT abdomen  Polysubstance abuse - Urine drug screen significant for benzodiazepine and THC.  Hyperglycemia - Glucose level was 312 on presentation, most likely stress related, hemoglobin A1c is 5.2  Code Status : Full  Family Communication  : Wife at bedside  Disposition Plan  : home when stable  Consults  :  none  Procedures  : None  DVT Prophylaxis  :   Heparin - SCDs   Lab Results  Component Value Date   PLT 167 11/28/2016    Antibiotics  :    Anti-infectives    Start     Dose/Rate Route Frequency Ordered Stop   11/27/16 1100  azithromycin (ZITHROMAX) 500 mg in dextrose 5 % 250 mL IVPB     500 mg 250 mL/hr over 60 Minutes Intravenous Every 24 hours 11/27/16 0939     11/27/16 0200  Ampicillin-Sulbactam (UNASYN) 3 g in sodium chloride 0.9 % 100 mL IVPB     3 g 200 mL/hr over 30 Minutes Intravenous Every 6 hours 11/27/16 0110     11/26/16 1900  Ampicillin-Sulbactam (UNASYN) 3 g in sodium chloride 0.9 % 100 mL IVPB     3 g 200 mL/hr over 30 Minutes Intravenous  Once 11/26/16 1849 11/26/16 2007        Objective:   Vitals:   11/27/16 0656 11/27/16 1438 11/27/16 2235 11/28/16 0652  BP: (!) 99/53  (!) 142/73 112/73  Pulse: 64  94 80  Resp: (!) 21  20 15   Temp: 98.2 F (36.8 C)  98 F (36.7 C) 98.4 F (36.9 C)  TempSrc: Oral  Oral Oral  SpO2: 99% 100% 96% 97%  Weight:      Height:        Wt Readings from Last 3 Encounters:  11/26/16 110.2 kg (242 lb 15.2 oz)  05/28/16 99.2 kg (218 lb 11.2 oz)  11/28/14 104.3 kg (230 lb)     Intake/Output Summary (Last 24 hours) at 11/28/16 1156 Last data filed at 11/28/16 0900  Gross per 24 hour  Intake          1013.33 ml  Output                0 ml  Net          1013.33 ml     Physical Exam  Awake alert oriented  3, no apparent distress  No JVD Use of accessory muscle, good air entry bilaterally, left lung rales S RRR,No Gallops,Rubs or new Murmurs, No Parasternal Heave Bowel sounds present, abdomen soft, no further tenderness in abdomen, no rebound or guarding No Cyanosis, Clubbing or edema, No new Rash or bruise      Data Review:    CBC  Recent Labs Lab 11/26/16 1754 11/27/16 0221 11/28/16 0558  WBC 21.6* 15.7* 9.0  HGB 14.9 12.5* 11.9*  HCT 45.4 36.7* 34.7*  PLT 255 157 167  MCV 90.1 86.2 85.5  MCH 29.6 29.3 29.3  MCHC 32.8 34.1 34.3  RDW 14.0 13.9 13.9  LYMPHSABS 10.4* 2.9  --   MONOABS 0.5 0.7  --   EOSABS 0.4 0.0  --   BASOSABS 0.1 0.0  --     Chemistries   Recent Labs Lab 11/26/16 1754 11/27/16 0221 11/28/16 0558  NA 138 136 138  K 4.3 3.9 3.9  CL 101 108 106  CO2 18* 22 27  GLUCOSE 312* 120* 99  BUN 12 15 8   CREATININE 2.05* 0.90 0.82  CALCIUM 9.0 7.5* 8.4*  AST 130* 69* 31  ALT 75* 66* 50  ALKPHOS 74 50 46  BILITOT 0.5 0.5 0.6   ------------------------------------------------------------------------------------------------------------------ No results for input(s): CHOL, HDL, LDLCALC, TRIG,  CHOLHDL, LDLDIRECT in the last 72 hours.  Lab Results  Component Value Date   HGBA1C 5.2 11/26/2016   ------------------------------------------------------------------------------------------------------------------ No results for input(s): TSH, T4TOTAL, T3FREE, THYROIDAB in the last 72 hours.  Invalid input(s): FREET3 ------------------------------------------------------------------------------------------------------------------ No results for input(s): VITAMINB12, FOLATE, FERRITIN, TIBC, IRON, RETICCTPCT in the last 72 hours.  Coagulation profile No results for input(s): INR, PROTIME in the last 168 hours.  No results for input(s): DDIMER in the last 72 hours.  Cardiac Enzymes No results for input(s): CKMB, TROPONINI, MYOGLOBIN in the last 168  hours.  Invalid input(s): CK ------------------------------------------------------------------------------------------------------------------ No results found for: BNP  Inpatient Medications  Scheduled Meds: . guaiFENesin  1,200 mg Oral BID  . heparin  5,000 Units Subcutaneous Q8H  . insulin aspart  0-5 Units Subcutaneous QHS  . insulin aspart  0-9 Units Subcutaneous TID WC   Continuous Infusions: . sodium chloride 100 mL/hr at 11/28/16 0150  . ampicillin-sulbactam (UNASYN) IV Stopped (11/28/16 0919)  . azithromycin Stopped (11/27/16 1202)  . famotidine (PEPCID) IV 20 mg (11/27/16 2330)   PRN Meds:.acetaminophen, LORazepam, LORazepam, ondansetron (ZOFRAN) IV, oxyCODONE, promethazine, traMADol  Micro Results Recent Results (from the past 240 hour(s))  Blood Culture (routine x 2)     Status: None (Preliminary result)   Collection Time: 11/26/16  6:59 PM  Result Value Ref Range Status   Specimen Description BLOOD RIGHT FOREARM  Final   Special Requests   Final    BOTTLES DRAWN AEROBIC AND ANAEROBIC Blood Culture results may not be optimal due to an inadequate volume of blood received in culture bottles   Culture NO GROWTH < 24 HOURS  Final   Report Status PENDING  Incomplete  Blood Culture (routine x 2)     Status: None (Preliminary result)   Collection Time: 11/26/16  7:10 PM  Result Value Ref Range Status   Specimen Description BLOOD RIGHT HAND  Final   Special Requests   Final    BOTTLES DRAWN AEROBIC AND ANAEROBIC Blood Culture results may not be optimal due to an inadequate volume of blood received in culture bottles   Culture NO GROWTH < 24 HOURS  Final   Report Status PENDING  Incomplete    Radiology Reports Ct Abdomen Pelvis W Contrast  Result Date: 11/27/2016 CLINICAL DATA:  Right-sided abdominal pain today. Syncope 11/26/2016. EXAM: CT ABDOMEN AND PELVIS WITH CONTRAST TECHNIQUE: Multidetector CT imaging of the abdomen and pelvis was performed using the standard  protocol following bolus administration of intravenous contrast. CONTRAST:  30mL ISOVUE-300 IOPAMIDOL (ISOVUE-300) INJECTION 61%, ISOVUE-300 IOPAMIDOL (ISOVUE-300) INJECTION 61% COMPARISON:  05/26/2016 and 11/28/2014 FINDINGS: Lower chest: There is a patchy hazy airspace process over the lingula and left lower lobe likely pneumonia. Tiny amount of left pleural fluid. Hepatobiliary: Previous cholecystectomy. No focal liver mass. Biliary tree within normal. Pancreas: Within normal. Spleen: Within normal. Adrenals/Urinary Tract: Adrenal glands are normal. Kidneys are normal in size without hydronephrosis or nephrolithiasis. Ureters are normal. Bladder is mildly distended and otherwise within normal. Stomach/Bowel: The stomach and small bowel are within normal. Appendix is normal. Colon is within normal. Vascular/Lymphatic: Abdominal aorta is within normal. Remaining vascular structures are normal. No significant adenopathy. Reproductive: Within normal. Other: No free fluid or focal inflammatory change visualize. Mesentery is within normal. Musculoskeletal: Within normal. IMPRESSION: No acute findings in the abdomen/pelvis. Airspace process over the lingula and left lower lobe with tiny pleural effusion. Findings likely represent a pneumonia. Electronically Signed   By: Elberta Fortis M.D.  On: 11/27/2016 14:44   Dg Chest Port 1 View  Result Date: 11/26/2016 CLINICAL DATA:  Altered mental status and tachypnea EXAM: PORTABLE CHEST 1 VIEW COMPARISON:  Chest radiograph 11/28/2014 FINDINGS: Cardiac silhouette is within normal limits allowing for AP technique. There is a large area of opacification at the lateral left lung base. The right lung is clear. No pneumothorax. IMPRESSION: Large area of left basilar consolidation. This could indicate pneumonia or aspiration pneumonitis. Electronically Signed   By: Deatra RobinsonKevin  Herman M.D.   On: 11/26/2016 18:40   Dg Abd Portable 1v  Result Date: 11/27/2016 CLINICAL DATA:  36  y/o M; right lower abdominal pain. History of ERCP and cholecystectomy in 2017. EXAM: PORTABLE ABDOMEN - 1 VIEW COMPARISON:  None. FINDINGS: The bowel gas pattern is normal. No radio-opaque calculi or other significant radiographic abnormality are seen. Right upper quadrant cholecystectomy clips. IMPRESSION: Normal bowel gas pattern. Electronically Signed   By: Mitzi HansenLance  Furusawa-Stratton M.D.   On: 11/27/2016 00:41     Akeem Heppler M.D on 11/28/2016 at 11:56 AM  Between 7am to 7pm - Pager - [phone]  After 7pm go to [email or web address] - password [email or web address]  [facility] -  Office  [phone]

## 2016-11-29 DIAGNOSIS — R109 Unspecified abdominal pain: Secondary | ICD-10-CM

## 2016-11-29 LAB — GLUCOSE, CAPILLARY
Glucose-Capillary: 103 mg/dL — ABNORMAL HIGH (ref 65–99)
Glucose-Capillary: 80 mg/dL (ref 65–99)

## 2016-11-29 LAB — BASIC METABOLIC PANEL
Anion gap: 5 (ref 5–15)
BUN: 9 mg/dL (ref 6–20)
CHLORIDE: 108 mmol/L (ref 101–111)
CO2: 26 mmol/L (ref 22–32)
CREATININE: 0.86 mg/dL (ref 0.61–1.24)
Calcium: 8.6 mg/dL — ABNORMAL LOW (ref 8.9–10.3)
GFR calc Af Amer: >60 mL/min (ref >60)
GFR calc non Af Amer: >60 mL/min (ref >60)
Glucose, Bld: 110 mg/dL — ABNORMAL HIGH (ref 65–99)
Potassium: 3.7 mmol/L (ref 3.5–5.1)
Sodium: 139 mmol/L (ref 135–145)

## 2016-11-29 LAB — CBC
HCT: 34.4 % — ABNORMAL LOW (ref 39.0–52.0)
HEMOGLOBIN: 12.1 g/dL — AB (ref 13.0–17.0)
MCH: 29.6 pg (ref 26.0–34.0)
MCHC: 35.2 g/dL (ref 30.0–36.0)
MCV: 84.1 fL (ref 78.0–100.0)
Platelets: 169 10*3/uL (ref 150–400)
RBC: 4.09 MIL/uL — ABNORMAL LOW (ref 4.22–5.81)
RDW: 13.4 % (ref 11.5–15.5)
WBC: 8.8 10*3/uL (ref 4.0–10.5)

## 2016-11-29 MED ORDER — SACCHAROMYCES BOULARDII 250 MG PO CAPS: 250.0000 mg | ORAL_CAPSULE | Freq: Two times a day (BID) | ORAL | 0 refills | Status: AC

## 2016-11-29 MED ORDER — LEVOFLOXACIN 750 MG PO TABS: 750.0000 mg | ORAL_TABLET | Freq: Every day | ORAL | 0 refills | Status: AC

## 2016-11-29 MED ORDER — GUAIFENESIN ER 600 MG PO TB12: 1200.0000 mg | ORAL_TABLET | Freq: Two times a day (BID) | ORAL | 0 refills | Status: AC

## 2016-11-29 MED ORDER — NICOTINE 21 MG/24HR TD PT24: 21.0000 mg | MEDICATED_PATCH | TRANSDERMAL | 0 refills | Status: AC

## 2016-11-29 NOTE — Progress Notes (Signed)
Patient is to be discharged home and in stable condition. IV and telemetry removed. Patient and wife present for discharge teaching and both verbalize understanding for the need for follow-up appointments and medication regimen. Patient denies needing a wheelchair and is requesting to walk out.  Quita SkyeMorgan P Dishmon, RN

## 2016-11-29 NOTE — Discharge Instructions (Signed)
Follow with Primary MD  in 7 days   Get CBC, CMP, 2 view Chest X ray checked  by Primary MD next visit.    Activity: As tolerated    Disposition Home    Diet: Regular diet  On your next visit with your primary care physician please Get Medicines reviewed and adjusted.   Please request your Prim.MD to go over all Hospital Tests and Procedure/Radiological results at the follow up, please get all Hospital records sent to your Prim MD by signing hospital release before you go home.   If you experience worsening of your admission symptoms, develop shortness of breath, life threatening emergency, suicidal or homicidal thoughts you must seek medical attention immediately by calling 911 or calling your MD immediately  if symptoms less severe.  You Must read complete instructions/literature along with all the possible adverse reactions/side effects for all the Medicines you take and that have been prescribed to you. Take any new Medicines after you have completely understood and accpet all the possible adverse reactions/side effects.   Do not drive, operating heavy machinery, perform activities at heights, swimming or participation in water activities or provide baby sitting services if your were admitted for syncope or siezures until you have seen by Primary MD or a Neurologist and advised to do so again.  Do not drive when taking Pain medications.    Do not take more than prescribed Pain, Sleep and Anxiety Medications  Special Instructions: If you have smoked or chewed Tobacco  in the last 2 yrs please stop smoking, stop any regular Alcohol  and or any Recreational drug use.  Wear Seat belts while driving.   Please note  You were cared for by a hospitalist during your hospital stay. If you have any questions about your discharge medications or the care you received while you were in the hospital after you are discharged, you can call the unit and asked to speak with the hospitalist on  call if the hospitalist that took care of you is not available. Once you are discharged, your primary care physician will handle any further medical issues. Please note that NO REFILLS for any discharge medications will be authorized once you are discharged, as it is imperative that you return to your primary care physician (or establish a relationship with a primary care physician if you do not have one) for your aftercare needs so that they can reassess your need for medications and monitor your lab values.

## 2016-11-29 NOTE — Discharge Summary (Signed)
Brad Ramirez, is a 36 y.o. male  DOB [birth year]  MRN [id number].  Admission date:  11/26/2016  Admitting Physician  Briscoe Deutscher, MD  Discharge Date:  11/29/2016   Primary MD  Assunta Found, MD  Recommendations for primary care physician for things to follow:  - Please check CBC, BMP during next visit, and continue counseling about tobacco cessation, and repeat 2 view chest x-ray to ensure resolution of pneumonia.   Admission Diagnosis  Septic shock (HCC) [A41.9, R65.21] Sepsis due to pneumonia (HCC) [J18.9, A41.9]   Discharge Diagnosis  Septic shock (HCC) [A41.9, R65.21] Sepsis due to pneumonia (HCC) [J18.9, A41.9]    Principal Problem:   Sepsis due to pneumonia [facility]) Active Problems:   Elevated LFTs   Polysubstance abuse   AKI (acute kidney injury) (HCC)   Acute respiratory failure with hypoxia (HCC)   Hyperglycemia      Past Medical History:  Diagnosis Date  . Opioid abuse     Past Surgical History:  Procedure Laterality Date  . CHOLECYSTECTOMY N/A 05/28/2016   Procedure: LAPAROSCOPIC CHOLECYSTECTOMY;  Surgeon: Harriette Bouillon, MD;  Location: [facility] OR;  Service: General;  Laterality: N/A;  . ERCP N/A 05/27/2016   Procedure: ENDOSCOPIC RETROGRADE CHOLANGIOPANCREATOGRAPHY (ERCP);  Surgeon: Dorena Cookey, MD;  Location: [facility] ENDOSCOPY;  Service: Endoscopy;  Laterality: N/A;  . TYMPANOSTOMY TUBE PLACEMENT Bilateral 1980s       History of present illness and  Hospital Course:     Kindly see H&P for history of present illness and admission details, please review complete Labs, Consult reports and Test reports for all details in brief  HPI  from the history and physical done on the day of admission 11/26/2016  HPI: Brad Ramirez is a 36 y.o. male with medical history significant for polysubstance abuse, now presenting to the emergency department after being found poorly responsive and pale  by a neighbor. Patient reports that he had just recovered from a brief illness marked by low grade fevers, lower abdominal discomfort, vomiting, and diarrhea. He had been ill with these symptoms for a couple days, but noted that they had seemed to resolve by yesterday. He reports being in his usual state this morning aside from a cough, exertional dyspnea, and chest congestion. He describes doing some chores about the house, then waking up in the ambulance. Patient denies any headache, change in vision or hearing, or focal numbness or weakness. He denies any recent alcohol or illicit substance use. EMS reports finding the patient poorly responsive and pale. They administered 4 mg of Narcan with no appreciable effect. He became more alert while en route. Only complaint by the time of arrival is dyspnea and cough.  ED Course: Upon arrival to the ED, patient is found to be afebrile, saturating 87% on room air, tachypneic in the mid 30s, mildly tachycardic, and with stable blood pressure. Chest x-ray demonstrates a large left basilar consolidation suggestive of pneumonia, possibly aspiration. Chemistry panels notable for serum creatinine of 2.05, up from 0.9 in  November 2017. AST and ALT are mildly elevated with normal bilirubin, and serum glucose is 312. CBC is notable for a leukocytosis to 21,600 and UDS is positive for benzodiazepines and THC. Salicylates and acetaminophen levels are undetectable. Blood cultures were obtained, 30 cc/kg NS bolus was given, and the patient was started on empiric Unasyn for a suspected aspiration pneumonia. Oxygen saturation normalized with 2 L/m via nasal cannula and the patient became increasingly more alert in the ED. He remained hemodynamically stable and will be admitted to the telemetry unit for ongoing evaluation and management of sepsis secondary to pneumonia.    Hospital Course   Sepsis secondary to community-acquired pneumonia - Presents with tachycardia, tachypnea,  leukocytosis and elevated lactic acid, chest x-ray with evidence of left lower lobe pneumonia, he reports vomiting befor 3 days, so aspiration is suspected,  - Physiology of sepsis has resolved  - Treated with IV Unasyn during hospital stay for suspicion for aspiration, as well was and azithromycin to cover for atypicals , continues to improve, so he will be discharged today on levofloxacin for another 4 days for total of 7 days treatment .. - Blood culture with no growth to date, sputum culture still pending at time of discharge  Hypoxic respiratory failure - Oxygen saturating 87 % on room air on presentation, this is secondary to pneumonia, resolved, tolerating room air and ambulating with no hypoxia  Abd pain, nausea and vomiting - KUB with no acute findings, having some tenderness in periumbilical area, CT abdomen pelvis with no acute findings, lipase within normal limits, - resolved  AKI - Creatinine 2.05 on admission, back to baseline , most likely due to volume depletion and dehydration. - Resolved  Elevated LFTs - No acute finding in CT abdomen - Most likely related to sepsis  Polysubstance abuse - Urine drug screen significant for benzodiazepine and THC.  Hyperglycemia - Glucose level was 312 on presentation, no hyperglycemia during hospital stay , most likely stress related, hemoglobin A1c is 5.2  Tobacco abuse - Patient was counseled   Discharge Condition:  Stable - D/W wife at bedside   Follow UP With PCP     Discharge Instructions  and  Discharge Medications    Discharge Instructions    Discharge instructions    Complete by:  As directed    Follow with Primary MD  in 7 days   Get CBC, CMP, 2 view Chest X ray checked  by Primary MD next visit.    Activity: As tolerated    Disposition Home    Diet: Regular diet  On your next visit with your primary care physician please Get Medicines reviewed and adjusted.   Please request your Prim.MD  to go over all Hospital Tests and Procedure/Radiological results at the follow up, please get all Hospital records sent to your Prim MD by signing hospital release before you go home.   If you experience worsening of your admission symptoms, develop shortness of breath, life threatening emergency, suicidal or homicidal thoughts you must seek medical attention immediately by calling 911 or calling your MD immediately  if symptoms less severe.  You Must read complete instructions/literature along with all the possible adverse reactions/side effects for all the Medicines you take and that have been prescribed to you. Take any new Medicines after you have completely understood and accpet all the possible adverse reactions/side effects.   Do not drive, operating heavy machinery, perform activities at heights, swimming or participation in water activities or provide baby  sitting services if your were admitted for syncope or siezures until you have seen by Primary MD or a Neurologist and advised to do so again.  Do not drive when taking Pain medications.    Do not take more than prescribed Pain, Sleep and Anxiety Medications  Special Instructions: If you have smoked or chewed Tobacco  in the last 2 yrs please stop smoking, stop any regular Alcohol  and or any Recreational drug use.  Wear Seat belts while driving.   Please note  You were cared for by a hospitalist during your hospital stay. If you have any questions about your discharge medications or the care you received while you were in the hospital after you are discharged, you can call the unit and asked to speak with the hospitalist on call if the hospitalist that took care of you is not available. Once you are discharged, your primary care physician will handle any further medical issues. Please note that NO REFILLS for any discharge medications will be authorized once you are discharged, as it is imperative that you return to your primary care  physician (or establish a relationship with a primary care physician if you do not have one) for your aftercare needs so that they can reassess your need for medications and monitor your lab values.   Increase activity slowly    Complete by:  As directed      Allergies as of 11/29/2016   No Known Allergies     Medication List    STOP taking these medications   ibuprofen 200 MG tablet Commonly known as:  MOTRIN IB     TAKE these medications   acetaminophen 325 MG tablet Commonly known as:  TYLENOL You can take 2 tablets every 4 hours as needed. Tylenol(acetaminophen) is in your prescription medication. He cannot take more than 4000 mg per day.   guaiFENesin 600 MG 12 hr tablet Commonly known as:  MUCINEX Take 2 tablets (1,200 mg total) by mouth 2 (two) times daily.   levofloxacin 750 MG tablet Commonly known as:  LEVAQUIN Take 1 tablet (750 mg total) by mouth daily. Start on 5/14 Start taking on:  11/30/2016   nicotine 21 mg/24hr patch Commonly known as:  NICODERM CQ - dosed in mg/24 hours Place 1 patch (21 mg total) onto the skin daily.   saccharomyces boulardii 250 MG capsule Commonly known as:  FLORASTOR Take 1 capsule (250 mg total) by mouth 2 (two) times daily.         Diet and Activity recommendation: See Discharge Instructions above   Consults obtained -  None   Major procedures and Radiology Reports - PLEASE review detailed and final reports for all details, in brief -      Ct Abdomen Pelvis W Contrast  Result Date: 11/27/2016 CLINICAL DATA:  Right-sided abdominal pain today. Syncope 11/26/2016. EXAM: CT ABDOMEN AND PELVIS WITH CONTRAST TECHNIQUE: Multidetector CT imaging of the abdomen and pelvis was performed using the standard protocol following bolus administration of intravenous contrast. CONTRAST:  30mL ISOVUE-300 IOPAMIDOL (ISOVUE-300) INJECTION 61%, ISOVUE-300 IOPAMIDOL (ISOVUE-300) INJECTION 61% COMPARISON:  05/26/2016 and 11/28/2014  FINDINGS: Lower chest: There is a patchy hazy airspace process over the lingula and left lower lobe likely pneumonia. Tiny amount of left pleural fluid. Hepatobiliary: Previous cholecystectomy. No focal liver mass. Biliary tree within normal. Pancreas: Within normal. Spleen: Within normal. Adrenals/Urinary Tract: Adrenal glands are normal. Kidneys are normal in size without hydronephrosis or nephrolithiasis. Ureters are normal. Bladder is  mildly distended and otherwise within normal. Stomach/Bowel: The stomach and small bowel are within normal. Appendix is normal. Colon is within normal. Vascular/Lymphatic: Abdominal aorta is within normal. Remaining vascular structures are normal. No significant adenopathy. Reproductive: Within normal. Other: No free fluid or focal inflammatory change visualize. Mesentery is within normal. Musculoskeletal: Within normal. IMPRESSION: No acute findings in the abdomen/pelvis. Airspace process over the lingula and left lower lobe with tiny pleural effusion. Findings likely represent a pneumonia. Electronically Signed   By: Elberta Fortisaniel  Boyle M.D.   On: 11/27/2016 14:44   Dg Chest Port 1 View  Result Date: 11/26/2016 CLINICAL DATA:  Altered mental status and tachypnea EXAM: PORTABLE CHEST 1 VIEW COMPARISON:  Chest radiograph 11/28/2014 FINDINGS: Cardiac silhouette is within normal limits allowing for AP technique. There is a large area of opacification at the lateral left lung base. The right lung is clear. No pneumothorax. IMPRESSION: Large area of left basilar consolidation. This could indicate pneumonia or aspiration pneumonitis. Electronically Signed   By: Deatra RobinsonKevin  Herman M.D.   On: 11/26/2016 18:40   Dg Abd Portable 1v  Result Date: 11/27/2016 CLINICAL DATA:  36 y/o M; right lower abdominal pain. History of ERCP and cholecystectomy in 2017. EXAM: PORTABLE ABDOMEN - 1 VIEW COMPARISON:  None. FINDINGS: The bowel gas pattern is normal. No radio-opaque calculi or other significant  radiographic abnormality are seen. Right upper quadrant cholecystectomy clips. IMPRESSION: Normal bowel gas pattern. Electronically Signed   By: Mitzi HansenLance  Furusawa-Stratton M.D.   On: 11/27/2016 00:41    Micro Results   Recent Results (from the past 240 hour(s))  Blood Culture (routine x 2)     Status: None (Preliminary result)   Collection Time: 11/26/16  6:59 PM  Result Value Ref Range Status   Specimen Description BLOOD RIGHT FOREARM  Final   Special Requests   Final    BOTTLES DRAWN AEROBIC AND ANAEROBIC Blood Culture results may not be optimal due to an inadequate volume of blood received in culture bottles   Culture NO GROWTH 2 DAYS  Final   Report Status PENDING  Incomplete  Blood Culture (routine x 2)     Status: None (Preliminary result)   Collection Time: 11/26/16  7:10 PM  Result Value Ref Range Status   Specimen Description BLOOD RIGHT HAND  Final   Special Requests   Final    BOTTLES DRAWN AEROBIC AND ANAEROBIC Blood Culture results may not be optimal due to an inadequate volume of blood received in culture bottles   Culture NO GROWTH 2 DAYS  Final   Report Status PENDING  Incomplete       Today   Subjective:   Brad Ramirez today has no headache,no chest or abdominal pain,no new weakness tingling or numbness, cough significantly subsided, nonproductive, no shortness of breath, feels much better wants to go home today.   Objective:   Blood pressure (!) 118/57, pulse 66, temperature 98.5 F (36.9 C), resp. rate 15, height 6\' 2"  (1.88 m), weight 110.2 kg (242 lb 15.2 oz), SpO2 97 %.   Intake/Output Summary (Last 24 hours) at 11/29/16 0858 Last data filed at 11/28/16 1700  Gross per 24 hour  Intake              360 ml  Output                0 ml  Net              360 ml  Exam Awake Alert, Oriented x 3, No new F.N deficits, Normal affect Symmetrical Chest wall movement, Good air movement bilaterally, CTAB RRR,No Gallops,Rubs or new Murmurs, No Parasternal  Heave +ve B.Sounds, Abd Soft, Non tender, No rebound -guarding or rigidity. No Cyanosis, Clubbing or edema, No new Rash or bruise  Data Review   CBC w Diff: Lab Results  Component Value Date   WBC 8.8 11/29/2016   HGB 12.1 (L) 11/29/2016   HCT 34.4 (L) 11/29/2016   PLT 169 11/29/2016   LYMPHOPCT 19 11/27/2016   MONOPCT 5 11/27/2016   EOSPCT 0 11/27/2016   BASOPCT 0 11/27/2016    CMP: Lab Results  Component Value Date   NA 139 11/29/2016   K 3.7 11/29/2016   CL 108 11/29/2016   CO2 26 11/29/2016   BUN 9 11/29/2016   CREATININE 0.86 11/29/2016   PROT 5.9 (L) 11/28/2016   ALBUMIN 3.5 11/28/2016   BILITOT 0.6 11/28/2016   ALKPHOS 46 11/28/2016   AST 31 11/28/2016   ALT 50 11/28/2016  .   Total Time in preparing paper work, data evaluation and todays exam - 35 minutes  Tahlia Deamer M.D on 11/29/2016 at 8:58 AM  [facility]   Office  [phone]

## 2016-12-01 LAB — CULTURE, BLOOD (ROUTINE X 2)
CULTURE: NO GROWTH
Culture: NO GROWTH

## 2016-12-24 ENCOUNTER — Other Ambulatory Visit (HOSPITAL_COMMUNITY): Payer: Self-pay | Admitting: Internal Medicine

## 2016-12-24 ENCOUNTER — Other Ambulatory Visit: Payer: Self-pay | Admitting: Internal Medicine

## 2016-12-24 DIAGNOSIS — N5089 Other specified disorders of the male genital organs: Secondary | ICD-10-CM

## 2016-12-25 ENCOUNTER — Ambulatory Visit (HOSPITAL_COMMUNITY): Payer: BLUE CROSS/BLUE SHIELD

## 2016-12-25 ENCOUNTER — Other Ambulatory Visit (HOSPITAL_COMMUNITY): Payer: Self-pay | Admitting: Internal Medicine

## 2016-12-25 ENCOUNTER — Ambulatory Visit (HOSPITAL_COMMUNITY)
Admission: RE | Admit: 2016-12-25 | Discharge: 2016-12-25 | Disposition: A | Discharge status: Home / Self Care | Payer: BLUE CROSS/BLUE SHIELD | Source: Ambulatory Visit | Attending: Internal Medicine | Admitting: Internal Medicine

## 2016-12-25 ENCOUNTER — Telehealth: Payer: Self-pay | Admitting: Internal Medicine

## 2016-12-25 DIAGNOSIS — J189 Pneumonia, unspecified organism: Secondary | ICD-10-CM

## 2016-12-25 DIAGNOSIS — N433 Hydrocele, unspecified: Secondary | ICD-10-CM | POA: Insufficient documentation

## 2016-12-25 DIAGNOSIS — N503 Cyst of epididymis: Secondary | ICD-10-CM | POA: Insufficient documentation

## 2016-12-25 DIAGNOSIS — N509 Disorder of male genital organs, unspecified: Secondary | ICD-10-CM | POA: Diagnosis not present

## 2016-12-25 DIAGNOSIS — N5089 Other specified disorders of the male genital organs: Secondary | ICD-10-CM

## 2017-03-19 ENCOUNTER — Ambulatory Visit (INDEPENDENT_AMBULATORY_CARE_PROVIDER_SITE_OTHER): Payer: BLUE CROSS/BLUE SHIELD | Admitting: Urology

## 2017-03-19 DIAGNOSIS — N4342 Spermatocele of epididymis, multiple: Secondary | ICD-10-CM | POA: Diagnosis not present

## 2017-08-17 NOTE — Progress Notes (Signed)
Psychiatric Initial Adult Assessment   Patient Identification: Brad Ramirez MRN:  [id number] Date of Evaluation:  08/23/2017 Referral Source: Self Chief Complaint:   Chief Complaint    Depression; Psychiatric Evaluation; Drug / Alcohol Assessment     Visit Diagnosis:    ICD-10-CM   1. Current moderate episode of major depressive disorder without prior episode (HCC) F32.1   2. Cocaine use disorder, moderate, dependence (HCC) F14.20   3. Uncomplicated opioid dependence (HCC) F11.20     History of Present Illness:   Brad Ramirez is a 37 y.o. year old male with a history of polysubstance use disorder (opioid, THC, benzo), who is referred for depression. He presents late for the appointment.   He states that he is here for depression, which has been worsening over several months.  He states that he has been struggling with substance use.  He has started to use about 10 years ago for recreational use. He remembers that he had significant fatigue even before starting to use drugs, although he denies history of depression.  The longest sobriety was 4-5 months after he completed [facility] in [facility]. He states that he felt worsening depression at that time and had a relapse.  He used to use opioids and cocaine.  The last use was three weeks ago. He notices that he gets "agitated" quickly when he is with his children, age 36 and 3613. He denies any violence. He tends to stay at home and is trying to find a job. He reports good support from his parents who lives one hour from his place. He would like to try any resources/treatment which would be helpful for him.   He feels depressed.  He endorses insomnia.  He feels fatigue and has anhedonia.  He states that he has had difficulty with taking a bath lately due to very low motivation.  He denies SI, HI.  He has mild anxiety.  He denies panic attacks. He rarely drinks alcohol; two to three beers every couple of weeks. He had a history of  worsening anxiety and tremors when he does not use substances. He denies history of seizure. Although he finds NA meeting to be helpful in the past, he has not gone there due to low motivation.   His wife presents to the interview.  She notices that he has worsening depression and anxiety. He tends to get tearful. He also seems to be very irritable lately, although she denies any safety concern.  His wife and the patient and agrees remove gun access at home.   Wt Readings from Last 3 Encounters:  08/23/17 242 lb (109.8 kg)  11/26/16 242 lb 15.2 oz (110.2 kg)  05/28/16 218 lb 11.2 oz (99.2 kg)    Per PMP,  06/01/2016 Oxycodone-Acetaminophen 5-325   Associated Signs/Symptoms: Depression Symptoms:  depressed mood, anhedonia, insomnia, fatigue, difficulty concentrating, (Hypo) Manic Symptoms:  denies decreased need for sleep or euphoria Anxiety Symptoms:  mild anxiety Psychotic Symptoms:  denies AH, VH,  mild paranoia that what other people thinks about him PTSD Symptoms: NA  Past Psychiatric History:  Outpatient: denies Psychiatry admission: [facility] in [facility] for 30 day Previous suicide attempt: denies Past trials of medication:  History of violence:   Previous Psychotropic Medications: No   Substance Abuse History in the last 12 months:  Yes.    Consequences of Substance Abuse: Withdrawal Symptoms:   Tremors  Past Medical History:  Past Medical History:  Diagnosis Date  . Opioid  abuse [facility])     Past Surgical History:  Procedure Laterality Date  . CHOLECYSTECTOMY N/A 05/28/2016   Procedure: LAPAROSCOPIC CHOLECYSTECTOMY;  Surgeon: Harriette Bouillon, MD;  Location: [facility] OR;  Service: General;  Laterality: N/A;  . ERCP N/A 05/27/2016   Procedure: ENDOSCOPIC RETROGRADE CHOLANGIOPANCREATOGRAPHY (ERCP);  Surgeon: Dorena Cookey, MD;  Location: [facility] ENDOSCOPY;  Service: Endoscopy;  Laterality: N/A;  . TYMPANOSTOMY TUBE PLACEMENT Bilateral 1980s    Family Psychiatric History:   maternal uncle- drug use  Family History:  Family History  Problem Relation Age of Onset  . Appendicitis Father   . Liver disease Neg Hx     Social History:   Social History   Socioeconomic History  . Marital status: Married    Spouse name: None  . Number of children: None  . Years of education: None  . Highest education level: None  Social Needs  . Financial resource strain: None  . Food insecurity - worry: None  . Food insecurity - inability: None  . Transportation needs - medical: None  . Transportation needs - non-medical: None  Occupational History  . None  Tobacco Use  . Smoking status: Current Every Day Smoker    Packs/day: 0.50    Years: 17.00    Pack years: 8.50    Types: Cigarettes  . Smokeless tobacco: Never Used  Substance and Sexual Activity  . Alcohol use: Yes    Comment: 05/26/2016 "might have 1/2 bottle of wine/month; if that"  . Drug use: No    Comment: hx of opiod abuse  . Sexual activity: None  Other Topics Concern  . None  Social History Narrative  . None    Additional Social History:  He was born and grew up in [place].  Education: graduated from college, Glass blower/designer in Data processing manager Work: unemployed Married, has two children (age 93,10)  Allergies:  No Known Allergies  Metabolic Disorder Labs: Lab Results  Component Value Date   HGBA1C 5.2 11/26/2016   MPG 103 11/26/2016   No results found for: PROLACTIN No results found for: CHOL, TRIG, HDL, CHOLHDL, VLDL, LDLCALC   Current Medications: Current Outpatient Medications  Medication Sig Dispense Refill  . acetaminophen (TYLENOL) 325 MG tablet You can take 2 tablets every 4 hours as needed. Tylenol(acetaminophen) is in your prescription medication. He cannot take more than 4000 mg per day.    . nicotine (NICODERM CQ - DOSED IN MG/24 HOURS) 21 mg/24hr patch Place 1 patch (21 mg total) onto the skin daily. 28 patch 0  . traZODone (DESYREL) 50 MG tablet 25-50 mg at night as needed  for sleep 30 tablet 0  . venlafaxine XR (EFFEXOR-XR) 37.5 MG 24 hr capsule 37.5 mg daily for one week, then 75 mg daily 60 capsule 0   No current facility-administered medications for this visit.     Neurologic: Headache: No Seizure: No Paresthesias:No  Musculoskeletal: Strength & Muscle Tone: within normal limits Gait & Station: normal Patient leans: N/A  Psychiatric Specialty Exam: Review of Systems  Psychiatric/Behavioral: Positive for depression. Negative for hallucinations, memory loss, substance abuse and suicidal ideas. The patient has insomnia. The patient is not nervous/anxious.   All other systems reviewed and are negative.   Blood pressure 129/75, pulse 74, height 6\' 2"  (1.88 m), weight 242 lb (109.8 kg), SpO2 100 %.Body mass index is 31.07 kg/m.  General Appearance: Fairly Groomed  Eye Contact:  Good  Speech:  Clear and Coherent  Volume:  Normal  Mood:  Depressed  Affect:  Appropriate, Congruent, Restricted and Tearful  Thought Process:  Coherent and Goal Directed  Orientation:  Full (Time, Place, and Person)  Thought Content:  Logical  Suicidal Thoughts:  No  Homicidal Thoughts:  No  Memory:  Immediate;   Good Recent;   Good Remote;   Good  Judgement:  Good  Insight:  Fair  Psychomotor Activity:  Normal  Concentration:  Concentration: Good and Attention Span: Good  Recall:  Good  Fund of Knowledge:Good  Language: Good  Akathisia:  No  Handed:  Right  AIMS (if indicated):  N/A  Assets:  Communication Skills Desire for Improvement  ADL's:  Intact  Cognition: WNL  Sleep:  poor   Assessment Brad Ramirez is a 37 y.o. year old male with a history of polysubstance use disorder (opioid, THC, benzo), who is referred for depression.   # MDD, moderate, recurrent without psychotic features Patient endorses neurovegetative symptoms in the setting of abstinent from drug use.  Other psychosocial stressors including unemployment.  Will start venlafaxine to  target depression.  Will consider adding Wellbutrin in the future if he has limited benefit from Effexor (prior auth required).  Discussed side effect which includes hypertension.  Will start trazodone as needed for insomnia.  He will greatly benefit from CBT; referral is made.   # Opioid use disorder # Cocaine use disorder Patient is at active phase for sobriety. Option of pharmacological treatment of suboxone in the future is discussed. May also consider naltrexone if he is interested. Will make referral to [facility]. Will continue motivational interview.   Plan 1. Start Effexor 37.5 mg daily for one week, then 75 mg daily  2. Start Trazodone 25-50 mg at night as needed for sleep 3. Referral to [facility] (chemical dependency intensive outpatient program) 4. Referral to therapy  5. Return to clinic in one month for 30 mins 6. Remove access to guns.  7. Obtain TSH at the next encounter 8. Will obtain more social history at the next encounter  The patient demonstrates the following risk factors for suicide: Chronic risk factors for suicide include: psychiatric disorder of depression and substance use disorder. Acute risk factors for suicide include: unemployment. Protective factors for this patient include: positive social support, responsibility to others (children, family), coping skills and hope for the future. Considering these factors, the overall suicide risk at this point appears to be low. Patient is appropriate for outpatient follow up. Both the patient and his wife agrees to removes patient's access to gun at home.     Treatment Plan Summary: Plan as above   Neysa Hotter, MD 2/4/201910:37 AM

## 2017-08-23 ENCOUNTER — Ambulatory Visit (HOSPITAL_COMMUNITY): Payer: BLUE CROSS/BLUE SHIELD | Admitting: Psychiatry

## 2017-08-23 ENCOUNTER — Telehealth (HOSPITAL_COMMUNITY): Payer: Self-pay | Admitting: Licensed Clinical Social Worker

## 2017-08-23 ENCOUNTER — Encounter (HOSPITAL_COMMUNITY): Payer: Self-pay | Admitting: Psychiatry

## 2017-08-23 VITALS — BP 129/75 | HR 74 | Ht 74.0 in | Wt 242.0 lb

## 2017-08-23 DIAGNOSIS — Z79899 Other long term (current) drug therapy: Secondary | ICD-10-CM | POA: Diagnosis not present

## 2017-08-23 DIAGNOSIS — F17210 Nicotine dependence, cigarettes, uncomplicated: Secondary | ICD-10-CM

## 2017-08-23 DIAGNOSIS — F1120 Opioid dependence, uncomplicated: Secondary | ICD-10-CM | POA: Diagnosis not present

## 2017-08-23 DIAGNOSIS — Z560 Unemployment, unspecified: Secondary | ICD-10-CM

## 2017-08-23 DIAGNOSIS — F331 Major depressive disorder, recurrent, moderate: Secondary | ICD-10-CM

## 2017-08-23 DIAGNOSIS — F1420 Cocaine dependence, uncomplicated: Secondary | ICD-10-CM | POA: Diagnosis not present

## 2017-08-23 DIAGNOSIS — F321 Major depressive disorder, single episode, moderate: Secondary | ICD-10-CM | POA: Insufficient documentation

## 2017-08-23 DIAGNOSIS — Z813 Family history of other psychoactive substance abuse and dependence: Secondary | ICD-10-CM | POA: Diagnosis not present

## 2017-08-23 MED ORDER — VENLAFAXINE HCL ER 37.5 MG PO CP24: ORAL_CAPSULE | ORAL | 0 refills | Status: DC

## 2017-08-23 MED ORDER — TRAZODONE HCL 50 MG PO TABS: ORAL_TABLET | ORAL | 0 refills | Status: DC

## 2017-08-23 NOTE — Telephone Encounter (Signed)
Pt was at son's basketball game. Made plans to contact each other tomorrow between 1-2pm to discuss [facility] [place].

## 2017-08-23 NOTE — Addendum Note (Signed)
Addended by: Neysa HotterHISADA, Marnette Perkins on: 08/23/2017 10:41 AM   Modules accepted: Orders

## 2017-08-23 NOTE — Patient Instructions (Signed)
1. Start Effexor 37.5 mg daily for one week, then 75 mg daily  2. Start Trazodone 25-50 mg at night as needed for sleep 3. Referral to CDIOP (chemical dependency intensive outpatient program) 4. Referral to therapy  5. Return to clinic in one month for 30 mins

## 2017-08-31 ENCOUNTER — Telehealth (HOSPITAL_COMMUNITY): Payer: Self-pay | Admitting: Psychology

## 2017-09-01 ENCOUNTER — Other Ambulatory Visit (HOSPITAL_COMMUNITY): Payer: Self-pay | Admitting: Psychiatry

## 2017-09-01 ENCOUNTER — Telehealth (HOSPITAL_COMMUNITY): Payer: Self-pay | Admitting: *Deleted

## 2017-09-01 MED ORDER — DULOXETINE HCL 20 MG PO CPEP: ORAL_CAPSULE | ORAL | 0 refills | Status: DC

## 2017-09-01 NOTE — Telephone Encounter (Signed)
Advise the following Week 1 Decrease Effexor 37.5 mg daily for one week, start Cymbalta 20 mg daily Week 2 Discontinue Effexor, increase Cymbalta 40 mg daily

## 2017-09-01 NOTE — Telephone Encounter (Signed)
Dr Vanetta ShawlHisada Patient called stating that the the Effexor is causing to have the urges he use to have such as drinking & drug use. Stated that Cymbalta hs previously suppressed the urges. Requesting to switch over /back to Cymbalta.  Call Back # [phone]

## 2017-09-01 NOTE — Telephone Encounter (Signed)
LVM informing per Dr Vanetta ShawlHisada instructions:  Advise the following Week 1 Decrease Effexor 37.5 mg daily for one week, start Cymbalta 20 mg daily Week 2 Discontinue Effexor, increase Cymbalta 40 mg daily

## 2017-09-02 ENCOUNTER — Telehealth (HOSPITAL_COMMUNITY): Payer: Self-pay | Admitting: *Deleted

## 2017-09-02 NOTE — Telephone Encounter (Signed)
Spoke with patient he will call [facility] . His urges are drug related. Assesment # given

## 2017-09-02 NOTE — Telephone Encounter (Signed)
Unable to reach patient about information shared by Dr Vanetta ShawlHisada Concerning the [facility] IN  & appointment to come in to  discuss medication's  for alcohol dependency & also talk about Naloxone.

## 2017-09-02 NOTE — Telephone Encounter (Signed)
I do not prescribe suboxone. Check in with the patient if he was able to [facility], if not, advise him to do so. He can also come for early appointment if able.   (I believe that he stated he had NOT tried suboxone at the last visit- it is at least not prescribed for the past few years per PMP, only fyi)

## 2017-09-02 NOTE — Telephone Encounter (Signed)
Dr Vanetta ShawlHisada Patient called in again today Stating that he feels bad. And that he really wants to go back on Suboxne   as previously prescribed. He stated again that it helps submerge the urges . CB # [phone]

## 2017-09-17 NOTE — Progress Notes (Deleted)
BH MD/PA/NP OP Progress Note  09/17/2017 9:15 AM Brad Ramirez  MRN:  [id number]  Chief Complaint:  HPI: *** Visit Diagnosis: No diagnosis found.  Past Psychiatric History:  I have reviewed the patient's psychiatry history in detail and updated the patient record. Outpatient: denies Psychiatry admission: [facility] in 2015 for 30 day Previous suicide attempt: denies Past trials of medication:  History of violence:     Past Medical History:  Past Medical History:  Diagnosis Date  . Opioid abuse [facility])     Past Surgical History:  Procedure Laterality Date  . CHOLECYSTECTOMY N/A 05/28/2016   Procedure: LAPAROSCOPIC CHOLECYSTECTOMY;  Surgeon: Harriette Bouillon, MD;  Location: [facility] OR;  Service: General;  Laterality: N/A;  . ERCP N/A 05/27/2016   Procedure: ENDOSCOPIC RETROGRADE CHOLANGIOPANCREATOGRAPHY (ERCP);  Surgeon: Dorena Cookey, MD;  Location: [facility] ENDOSCOPY;  Service: Endoscopy;  Laterality: N/A;  . TYMPANOSTOMY TUBE PLACEMENT Bilateral 1980s    Family Psychiatric History: I have reviewed the patient's family history in detail and updated the patient record. maternal uncle- drug use  Family History:  Family History  Problem Relation Age of Onset  . Appendicitis Father   . Liver disease Neg Hx     Social History:  Social History   Socioeconomic History  . Marital status: Married    Spouse name: Not on file  . Number of children: Not on file  . Years of education: Not on file  . Highest education level: Not on file  Social Needs  . Financial resource strain: Not on file  . Food insecurity - worry: Not on file  . Food insecurity - inability: Not on file  . Transportation needs - medical: Not on file  . Transportation needs - non-medical: Not on file  Occupational History  . Not on file  Tobacco Use  . Smoking status: Current Every Day Smoker    Packs/day: 0.50    Years: 17.00    Pack years: 8.50    Types: Cigarettes  . Smokeless tobacco: Never Used   Substance and Sexual Activity  . Alcohol use: Yes    Comment: 05/26/2016 "might have 1/2 bottle of wine/month; if that"  . Drug use: No    Comment: hx of opiod abuse  . Sexual activity: Not on file  Other Topics Concern  . Not on file  Social History Narrative  . Not on file   He was born and grew up in [place].  Education: graduated from college, Glass blower/designer in Data processing manager Work: unemployed Married, has two children (age 46,10)    Allergies: No Known Allergies  Metabolic Disorder Labs: Lab Results  Component Value Date   HGBA1C 5.2 11/26/2016   MPG 103 11/26/2016   No results found for: PROLACTIN No results found for: CHOL, TRIG, HDL, CHOLHDL, VLDL, LDLCALC No results found for: TSH  Therapeutic Level Labs: No results found for: LITHIUM No results found for: VALPROATE No components found for:  CBMZ  Current Medications: Current Outpatient Medications  Medication Sig Dispense Refill  . acetaminophen (TYLENOL) 325 MG tablet You can take 2 tablets every 4 hours as needed. Tylenol(acetaminophen) is in your prescription medication. He cannot take more than 4000 mg per day.    . DULoxetine (CYMBALTA) 20 MG capsule 20 mg daily for one week, then 40 mg daily 60 capsule 0  . nicotine (NICODERM CQ - DOSED IN MG/24 HOURS) 21 mg/24hr patch Place 1 patch (21 mg total) onto the skin daily. 28 patch 0  . traZODone (  DESYREL) 50 MG tablet 25-50 mg at night as needed for sleep 30 tablet 0   No current facility-administered medications for this visit.      Musculoskeletal: Strength & Muscle Tone: within normal limits Gait & Station: normal Patient leans: N/A  Psychiatric Specialty Exam: ROS  There were no vitals taken for this visit.There is no height or weight on file to calculate BMI.  General Appearance: Fairly Groomed  Eye Contact:  Good  Speech:  Clear and Coherent  Volume:  Normal  Mood:  {BHH MOOD:22306}  Affect:  {Affect (PAA):22687}  Thought Process:  Coherent  and Goal Directed  Orientation:  Full (Time, Place, and Person)  Thought Content: Logical   Suicidal Thoughts:  {ST/HT (PAA):22692}  Homicidal Thoughts:  {ST/HT (PAA):22692}  Memory:  Immediate;   Good Recent;   Good Remote;   Good  Judgement:  {Judgement (PAA):22694}  Insight:  {Insight (PAA):22695}  Psychomotor Activity:  Normal  Concentration:  Concentration: Good and Attention Span: Good  Recall:  Good  Fund of Knowledge: Good  Language: Good  Akathisia:  No  Handed:  Right  AIMS (if indicated): not done  Assets:  Communication Skills Desire for Improvement  ADL's:  Intact  Cognition: WNL  Sleep:  {BHH GOOD/FAIR/POOR:22877}   Screenings:   Assessment and Plan:  Brad Ramirez is a 37 y.o. year old male with a history of depression, polysubstance use disorder (opioid, THC, benzo), who presents for follow up appointment for No diagnosis found.  # MDD, moderate, recurrent without psychotic features  Patient endorses neurovegetative symptoms in the setting of abstinent from drug use.  Other psychosocial stressors including unemployment.  Will start venlafaxine to target depression.  Will consider adding Wellbutrin in the future if he has limited benefit from Effexor (prior auth required).  Discussed side effect which includes hypertension.  Will start trazodone as needed for insomnia.  He will greatly benefit from CBT; referral is made.   # Opioid use disorder # Cocaine use disorder  Patient is at active phase for sobriety. Option of pharmacological treatment of suboxone in the future is discussed. May also consider naltrexone if he is interested. Will make referral to [facility]. Will continue motivational interview.   Plan 1. Start Effexor 37.5 mg daily for one week, then 75 mg daily  2. Start Trazodone 25-50 mg at night as needed for sleep 3. Referral to CDIOP (chemical dependency intensive outpatient program) 4. Referral to therapy  5. Return to clinic in one month for  30 mins 6. Remove access to guns.  7. Obtain TSH at the next encounter 8. Will obtain more social history at the next encounter  The patient demonstrates the following risk factors for suicide: Chronic risk factors for suicide include: psychiatric disorder of depression and substance use disorder. Acute risk factors for suicide include: unemployment. Protective factors for this patient include: positive social support, responsibility to others (children, family), coping skills and hope for the future. Considering these factors, the overall suicide risk at this point appears to be low. Patient is appropriate for outpatient follow up. Both the patient and his wife agrees to removes patient's access to gun at home.      Neysa Hottereina Lemario Chaikin, MD 09/17/2017, 9:15 AM

## 2017-09-20 ENCOUNTER — Ambulatory Visit (HOSPITAL_COMMUNITY): Payer: BLUE CROSS/BLUE SHIELD | Admitting: Licensed Clinical Social Worker

## 2017-09-20 ENCOUNTER — Encounter (HOSPITAL_COMMUNITY): Payer: Self-pay | Admitting: Licensed Clinical Social Worker

## 2017-09-20 DIAGNOSIS — F1420 Cocaine dependence, uncomplicated: Secondary | ICD-10-CM | POA: Diagnosis not present

## 2017-09-20 DIAGNOSIS — F1120 Opioid dependence, uncomplicated: Secondary | ICD-10-CM | POA: Diagnosis not present

## 2017-09-20 DIAGNOSIS — F321 Major depressive disorder, single episode, moderate: Secondary | ICD-10-CM

## 2017-09-20 NOTE — Progress Notes (Signed)
Comprehensive Clinical Assessment (CCA) Note  09/20/2017 Rafael BihariJacob R Mackert [id number]  Visit Diagnosis:      ICD-10-CM   1. Current moderate episode of major depressive disorder without prior episode (HCC) F32.1   2. Cocaine use disorder, moderate, dependence (HCC) F14.20   3. Uncomplicated opioid dependence (HCC) F11.20       CCA Part One  Part One has been completed on paper by the patient.  (See scanned document in Chart Review)  CCA Part Two A  Intake/Chief Complaint:  CCA Intake With Chief Complaint CCA Part Two Date: 09/20/17 CCA Part Two Time: 1308 Chief Complaint/Presenting Problem: Depression and substance use(Patient is a 37 year old Caucasian male that presents oriented x5 (person, place, situation, time and object), alert, casually dressed, appropriately groomed, euthymic, and cooperative) Patients Currently Reported Symptoms/Problems: Mood: no energy, no drive, feels dread, feels drained, feels "the blues," increase appetite, increase in irritability, without medication reduce sleep, feelings of hopelessness and worthlessness, difficulty with concentration,  substance use: oxycodone  Collateral Involvement: None Individual's Strengths: Kind, caring, good father, good husband, hard worker Individual's Preferences: Prefer being outdoors, prefer being around people he enjoys, prefers being Bankeractive/playing sports, doesn't prefer to be alone, doesn't prefer drama  Individual's Abilities: Salesman, pest control, kind, caring, good father, good husband, hard worker  Type of Services Patient Feels Are Needed: Therapy, medication management Initial Clinical Notes/Concerns: Symptoms started around age 37 when he started taking pain killers after developing painful boils but increased after that, symptoms occur daily, symptoms are moderate to severe   Mental Health Symptoms Depression:  Depression: Change in energy/activity, Difficulty Concentrating, Hopelessness, Worthlessness,  Irritability  Mania:  Mania: N/A  Anxiety:   Anxiety: N/A  Psychosis:  Psychosis: N/A  Trauma:  Trauma: N/A  Obsessions:  Obsessions: N/A  Compulsions:  Compulsions: N/A  Inattention:  Inattention: Avoids/dislikes activities that require focus  Hyperactivity/Impulsivity:  Hyperactivity/Impulsivity: N/A  Oppositional/Defiant Behaviors:  Oppositional/Defiant Behaviors: N/A  Borderline Personality:  Emotional Irregularity: N/A  Other Mood/Personality Symptoms:  Other Mood/Personality Symtpoms: None    Mental Status Exam Appearance and self-care  Stature:  Stature: Average  Weight:  Weight: Average weight  Clothing:  Clothing: Neat/clean  Grooming:  Grooming: Normal  Cosmetic use:  Cosmetic Use: None  Posture/gait:  Posture/Gait: Normal  Motor activity:  Motor Activity: Not Remarkable  Sensorium  Attention:  Attention: Normal  Concentration:  Concentration: Normal  Orientation:  Orientation: X5  Recall/memory:  Recall/Memory: Normal  Affect and Mood  Affect:  Affect: Appropriate  Mood:  Mood: Euthymic  Relating  Eye contact:  Eye Contact: Normal  Facial expression:  Facial Expression: Responsive  Attitude toward examiner:  Attitude Toward Examiner: Cooperative  Thought and Language  Speech flow: Speech Flow: Normal  Thought content:  Thought Content: Appropriate to mood and circumstances  Preoccupation:  Preoccupations: (None)  Hallucinations:  Hallucinations: (None)  Organization:   Logical   Company secretaryxecutive Functions  Fund of Knowledge:  Fund of Knowledge: Average  Intelligence:  Intelligence: Average  Abstraction:  Abstraction: Normal  Judgement:  Judgement: Normal  Reality Testing:  Reality Testing: Adequate  Insight:  Insight: Fair  Decision Making:  Decision Making: Normal  Social Functioning  Social Maturity:  Social Maturity: Impulsive  Social Judgement:  Social Judgement: Normal  Stress  Stressors:  Stressors: Scientist, forensicMoney  Coping Ability:  Coping Ability: Human resources officerverwhelmed   Skill Deficits:   Substance abuse   Supports:   Family    Family and Psychosocial History: Family history Marital status:  Married Number of Years Married: 12 What types of issues is patient dealing with in the relationship?: His substance abuse at times has caused arguments  Are you sexually active?: Yes What is your sexual orientation?: Heterosexual  Has your sexual activity been affected by drugs, alcohol, medication, or emotional stress?: None  Does patient have children?: Yes How many children?: 2 How is patient's relationship with their children?: Son and daughter, Good relationship with children  Childhood History:  Childhood History By whom was/is the patient raised?: Both parents Additional childhood history information: Patient had a "good" childhood Description of patient's relationship with caregiver when they were a child: Mother: Good relationship, Father: Good relationship Patient's description of current relationship with people who raised him/her: Mother: Good relationship, Father: Good relationship  How were you disciplined when you got in trouble as a child/adolescent?: Spanked, grounded, privileges taken away  Does patient have siblings?: Yes Number of Siblings: 3 Description of patient's current relationship with siblings: 2 brothers, sister, Good relationships  Did patient suffer any verbal/emotional/physical/sexual abuse as a child?: No Did patient suffer from severe childhood neglect?: No Has patient ever been sexually abused/assaulted/raped as an adolescent or adult?: No Was the patient ever a victim of a crime or a disaster?: No Witnessed domestic violence?: No Has patient been effected by domestic violence as an adult?: No  CCA Part Two B  Employment/Work Situation: Employment / Work Psychologist, occupational Employment situation: Unemployed What is the longest time patient has a held a job?: 9 years Where was the patient employed at that time?: [employer]  Has  patient ever been in the [employer]?: No Has patient ever served in combat?: No Did You Receive Any Psychiatric Treatment/Services While in Equities trader?: No Are There Guns or Other Weapons in Your Home?: Yes Types of Guns/Weapons: handguns  Are These Comptroller?: Yes  Education: Education School Currently Attending: N/A: Adult  Last Grade Completed: 12 Name of High School: [employer]  Did Garment/textile technologist From [employer]?: Yes Did Theme park manager?: (Some college) Did Designer, television/film set?: No Did You Have Any Special Interests In School?: Science, History  Did You Have An Individualized Education Program (IIEP): No Did You Have Any Difficulty At [employer]?: Yes Were Any Medications Ever Prescribed For These Difficulties?: No  Religion: Religion/Spirituality Are You A Religious Person?: Yes What is Your Religious Affiliation?: Catholic How Might This Affect Treatment?: Support in treatment   Leisure/Recreation: Leisure / Recreation Leisure and Hobbies: Golf, outdoors   Exercise/Diet: Exercise/Diet Do You Exercise?: No Have You Gained or Lost A Significant Amount of Weight in the Past Six Months?: No Do You Follow a Special Diet?: No Do You Have Any Trouble Sleeping?: (Without medication wakes up during the night)  CCA Part Two C  Alcohol/Drug Use: Alcohol / Drug Use Pain Medications: See patient record Prescriptions: See patient record Over the Counter: See patient record  History of alcohol / drug use?: Yes Longest period of sobriety (when/how long): 3-4 months  Negative Consequences of Use: Legal, Financial, Personal relationships, Work / School Withdrawal Symptoms: Agitation, Irritability, Aggressive/Assaultive, Fever / Chills Substance #1 Name of Substance 1: Cocaine  1 - Age of First Use: In college 1 - Amount (size/oz): half a gram 1 - Frequency: every 2-3 days  1 - Duration: 1 month  1 - Last Use / Amount: Jan 2019 Substance #2 Name  of Substance 2: Oxy 2 - Age of First Use: 24 2 - Amount (size/oz): started with 1  to 2 pills and went up to 5 pills  2 - Frequency: Daily 2 - Duration: 5 years then 3-4 months sobriety and then relapse 2 - Last Use / Amount: Jan. 2019                  CCA Part Three  ASAM's:  Six Dimensions of Multidimensional Assessment  Dimension 1:  Acute Intoxication and/or Withdrawal Potential:  Dimension 1:  Comments: No symptoms currently but difficult with withdrawal   Dimension 2:  Biomedical Conditions and Complications:  Dimension 2:  Comments: None   Dimension 3:  Emotional, Behavioral, or Cognitive Conditions and Complications:  Dimension 3:  Comments: Depressive like symptoms after withdrawal   Dimension 4:  Readiness to Change:     Dimension 5:  Relapse, Continued use, or Continued Problem Potential:  Dimension 5:  Comments: Motivated to change  Dimension 6:  Recovery/Living Environment:  Dimension 6:  Recovery/Living Environment Comments: Supportive environment    Substance use Disorder (SUD) Substance Use Disorder (SUD)  Checklist Symptoms of Substance Use: Continued use despite persistent or recurrent social, interpersonal problems, caused or exacerbated by use, Presence of craving or strong urge to use  Social Function:  Social Functioning Social Maturity: Impulsive Social Judgement: Normal  Stress:  Stress Stressors: Money Coping Ability: Overwhelmed Patient Takes Medications The Way The Doctor Instructed?: Yes Priority Risk: Low Acuity  Risk Assessment- Self-Harm Potential: Risk Assessment For Self-Harm Potential Thoughts of Self-Harm: No current thoughts Method: No plan  Risk Assessment -Dangerous to Others Potential: Risk Assessment For Dangerous to Others Potential Method: No Plan Availability of Means: No access or NA Intent: Vague intent or NA  DSM5 Diagnoses: Patient Active Problem List   Diagnosis Date Noted  . Current moderate episode of major  depressive disorder without prior episode (HCC) 08/23/2017  . Cocaine use disorder, moderate, dependence (HCC) 08/23/2017  . Uncomplicated opioid dependence (HCC) 08/23/2017  . Sepsis due to pneumonia (HCC) 11/26/2016  . Polysubstance abuse (HCC) 11/26/2016  . AKI (acute kidney injury) (HCC) 11/26/2016  . Acute respiratory failure with hypoxia (HCC) 11/26/2016  . Hyperglycemia 11/26/2016  . Biliary obstruction 05/26/2016  . Leukocytosis 05/26/2016  . Hyperbilirubinemia 05/26/2016  . Elevated LFTs 05/26/2016  . Nausea & vomiting 05/26/2016  . GERD (gastroesophageal reflux disease) 05/26/2016    Patient Centered Plan: Patient is on the following Treatment Plan(s):  Depression  Recommendations for Services/Supports/Treatments: Recommendations for Services/Supports/Treatments Recommendations For Services/Supports/Treatments: Individual Therapy, Medication Management  Treatment Plan Summary: OP Treatment Plan Summary: Izea will reduce symptoms of depression and manage substance use by "feel better than what I do" for 5 out of 7 days for 60 days.    Patient is a 37 year old Caucasian male that presents oriented x5 (person, place, situation, time and object), alert, casually dressed, appropriately groomed, euthymic, and cooperative for an assessment on a referral from Dr. Vanetta Shawl to address mood and substance use. Patient has a history of medical treatment including hyperglycemia, respiratory issues, nausea and vomiting. Patient has a history of mental health treatment including substance abuse in 2015. Patient denies symptoms of mania. Patient denies suicidal and homicidal ideations. He denies psychosis including auditory and visual hallucinations. Patient has a history of substance abuse including cocaine use and opioid use. Patient denies history of elopement. Patient is at low risk for lethality at this time. Patient would benefit from outpatient therapy with a CBT approach 1-4 times a month  to address mood and substance abuse. Patient would benefit  from medication management to manage mood.   Referrals to Alternative Service(s): Referred to Alternative Service(s):   Place:   Date:   Time:    Referred to Alternative Service(s):   Place:   Date:   Time:    Referred to Alternative Service(s):   Place:   Date:   Time:    Referred to Alternative Service(s):   Place:   Date:   Time:     Bynum Bellows, LCSW

## 2017-09-21 ENCOUNTER — Ambulatory Visit (HOSPITAL_COMMUNITY): Payer: Self-pay | Admitting: Psychiatry

## 2017-09-28 ENCOUNTER — Ambulatory Visit (HOSPITAL_COMMUNITY): Payer: Self-pay | Admitting: Psychiatry

## 2017-09-28 ENCOUNTER — Other Ambulatory Visit (HOSPITAL_COMMUNITY): Payer: Self-pay | Admitting: Psychiatry

## 2017-09-28 MED ORDER — TRAZODONE HCL 50 MG PO TABS: ORAL_TABLET | ORAL | 0 refills | Status: DC

## 2017-09-29 NOTE — Progress Notes (Signed)
BH MD/PA/NP OP Progress Note  10/04/2017 10:00 AM Brad Ramirez  MRN:  [id number]  Chief Complaint:  Chief Complaint    Depression; Follow-up; Drug Problem     HPI:  Patient presents for follow-up appointment for depression and substance use disorder.  He states that he has been doing better since he was started on Suboxone last week.  Although he still has strong craving at times, he is able to manage it.  He is still  interested in CD IOP, although he missed to call back to a therapist. He ran out of Effexor a few days ago, and he denies any side effect. He feels less irritable and less isolated. He enjoys being with his children (age 36, 2613). He reports good relationship with his wife. He mostly do house chores and is currently searching for a job.  He feels depressed, although it improved some.  He has more energy, although he still feels fatigue.  He has fair concentration.  He denies SI.  He denies feeling anxious.  He used to sleep well before he ran out of trazodone.  The last alcohol use was about a month ago, drinking two beers. He denies opioid or cocaine use since the last visit.   Per PMP,  On Suboxone, filled by Grant Fontanaharles Wayne Plummer, MD    Visit Diagnosis:    ICD-10-CM   1. MDD (major depressive disorder), recurrent episode, moderate (HCC) F33.1 TSH  2. Current moderate episode of major depressive disorder without prior episode (HCC) F32.1   3. Uncomplicated opioid dependence (HCC) F11.20   4. Cocaine use disorder, moderate, dependence (HCC) F14.20     Past Psychiatric History:  I have reviewed the patient's psychiatry history in detail and updated the patient record. Outpatient: denies Psychiatry admission: [facility] in 2015 for 30 day Previous suicide attempt: denies Past trials of medication:  History of violence:   Past Medical History:  Past Medical History:  Diagnosis Date  . Opioid abuse [facility](HCC)     Past Surgical History:  Procedure Laterality Date   . CHOLECYSTECTOMY N/A 05/28/2016   Procedure: LAPAROSCOPIC CHOLECYSTECTOMY;  Surgeon: Harriette Bouillonhomas Cornett, MD;  [facility];  Service: General;  Laterality: N/A;  . ERCP N/A 05/27/2016   Procedure: ENDOSCOPIC RETROGRADE CHOLANGIOPANCREATOGRAPHY (ERCP);  Surgeon: Dorena CookeyJohn Hayes, MD;  [facility];  Service: Endoscopy;  Laterality: N/A;  . TYMPANOSTOMY TUBE PLACEMENT Bilateral 1980s    Family Psychiatric History: I have reviewed the patient's family history in detail and updated the patient record.  Family History:  Family History  Problem Relation Age of Onset  . Appendicitis Father   . Liver disease Neg Hx     Social History:  Social History   Socioeconomic History  . Marital status: Married    Spouse name: None  . Number of children: None  . Years of education: None  . Highest education level: None  Social Needs  . Financial resource strain: None  . Food insecurity - worry: None  . Food insecurity - inability: None  . Transportation needs - medical: None  . Transportation needs - non-medical: None  Occupational History  . None  Tobacco Use  . Smoking status: Current Every Day Smoker    Packs/day: 0.50    Years: 17.00    Pack years: 8.50    Types: Cigarettes  . Smokeless tobacco: Never Used  Substance and Sexual Activity  . Alcohol use: Yes    Comment: 05/26/2016 "might have 1/2 bottle of wine/month;  if that"  . Drug use: No    Comment: hx of opiod abuse  . Sexual activity: None  Other Topics Concern  . None  Social History Narrative  . None    Allergies: No Known Allergies  Metabolic Disorder Labs: Lab Results  Component Value Date   HGBA1C 5.2 11/26/2016   MPG 103 11/26/2016   No results found for: PROLACTIN No results found for: CHOL, TRIG, HDL, CHOLHDL, VLDL, LDLCALC No results found for: TSH  Therapeutic Level Labs: No results found for: LITHIUM No results found for: VALPROATE No components found for:  CBMZ  Current Medications: Current  Outpatient Medications  Medication Sig Dispense Refill  . acetaminophen (TYLENOL) 325 MG tablet You can take 2 tablets every 4 hours as needed. Tylenol(acetaminophen) is in your prescription medication. He cannot take more than 4000 mg per day.    . buprenorphine-naloxone (SUBOXONE) 8-2 mg SUBL SL tablet Place 1 tablet under the tongue 2 (two) times daily.    . nicotine (NICODERM CQ - DOSED IN MG/24 HOURS) 21 mg/24hr patch Place 1 patch (21 mg total) onto the skin daily. 28 patch 0  . traZODone (DESYREL) 50 MG tablet Take 1 tablet (50 mg total) by mouth at bedtime as needed for sleep. 30 tablet 1  . venlafaxine XR (EFFEXOR-XR) 150 MG 24 hr capsule Take 1 capsule (150 mg total) by mouth daily with breakfast. 30 capsule 1   No current facility-administered medications for this visit.      Musculoskeletal: Strength & Muscle Tone: within normal limits Gait & Station: normal Patient leans: N/A  Psychiatric Specialty Exam: Review of Systems  Psychiatric/Behavioral: Positive for depression. Negative for hallucinations, memory loss, substance abuse and suicidal ideas. The patient is nervous/anxious and has insomnia.   All other systems reviewed and are negative.   Blood pressure 117/77, pulse 90, height 6\' 2"  (1.88 m), weight 243 lb (110.2 kg), SpO2 99 %.Body mass index is 31.2 kg/m.  General Appearance: Fairly Groomed  Eye Contact:  Good  Speech:  Clear and Coherent  Volume:  Normal  Mood:  Depressed  Affect:  Appropriate, Congruent and slightly down  Thought Process:  Coherent and Goal Directed  Orientation:  Full (Time, Place, and Person)  Thought Content: Logical   Suicidal Thoughts:  No  Homicidal Thoughts:  No  Memory:  Immediate;   Good Recent;   Good Remote;   Good  Judgement:  Good  Insight:  Fair  Psychomotor Activity:  Normal  Concentration:  Concentration: Good and Attention Span: Good  Recall:  Good  Fund of Knowledge: Good  Language: Good  Akathisia:  No  Handed:   Right  AIMS (if indicated): not done  Assets:  Communication Skills Desire for Improvement  ADL's:  Intact  Cognition: WNL  Sleep:  Fair   Screenings:   Assessment and Plan:  KAIS MONJE is a 37 y.o. year old male with a history of depression, polysubstance use disorder (opioid, THC, benzo) , who presents for follow up appointment for MDD (major depressive disorder), recurrent episode, moderate (HCC) - Plan: TSH  Current moderate episode of major depressive disorder without prior episode (HCC)  Uncomplicated opioid dependence (HCC)  Cocaine use disorder, moderate, dependence (HCC)  # MDD, moderate, single episode without psychotic features Although patient reports slight improvement in neurovegetative symptoms, he ran out of Effexor and had residual neurovegetative symptoms with decreased energy.  Will uptitrate venlafaxine to target depression. (Noted that he was not aware of duloxetine  order and has not tried this medication). Discussed risk of hypertension. Will continue trazodone prn for insomnia. He will continue to see Mr. Sheets for therapy.   # Opioid use disorder # Cocaine use disorder Patient is at active phase for sobriety. He was started on suboxone at other clinic. He is very motivated to enroll in CDIOP; will make referral again.   Plan I have reviewed and updated plans as below 1. Increase Effexor 150 mg daily  2. Continue Trazodone 50 mg at night as needed for sleep 3. Referral to CDIOP again(chemical dependency intensive outpatient program) 4. Return to clinic in two months for 15 mins 5. Obtain TSH  The patient demonstrates the following risk factors for suicide: Chronic risk factors for suicide include: psychiatric disorder of depression and substance use disorder. Acute risk factors for suicide include: unemployment. Protective factors for this patient include: positive social support, responsibility to others (children, family), coping skills and hope  for the future. Considering these factors, the overall suicide risk at this point appears to be low. Patient is appropriate for outpatient follow up. Both the patient and his wife agrees to removes patient's access to gun at home.    The duration of this appointment visit was 30 minutes of face-to-face time with the patient.  Greater than 50% of this time was spent in counseling, explanation of  diagnosis, planning of further management, and coordination of care.  Neysa Hotter, MD 10/04/2017, 10:00 AM

## 2017-10-04 ENCOUNTER — Ambulatory Visit (INDEPENDENT_AMBULATORY_CARE_PROVIDER_SITE_OTHER): Payer: BLUE CROSS/BLUE SHIELD | Admitting: Psychiatry

## 2017-10-04 ENCOUNTER — Encounter (HOSPITAL_COMMUNITY): Payer: Self-pay | Admitting: Psychiatry

## 2017-10-04 ENCOUNTER — Telehealth (HOSPITAL_COMMUNITY): Payer: Self-pay | Admitting: Psychology

## 2017-10-04 VITALS — BP 117/77 | HR 90 | Ht 74.0 in | Wt 243.0 lb

## 2017-10-04 DIAGNOSIS — F419 Anxiety disorder, unspecified: Secondary | ICD-10-CM

## 2017-10-04 DIAGNOSIS — F1420 Cocaine dependence, uncomplicated: Secondary | ICD-10-CM

## 2017-10-04 DIAGNOSIS — F331 Major depressive disorder, recurrent, moderate: Secondary | ICD-10-CM

## 2017-10-04 DIAGNOSIS — F17210 Nicotine dependence, cigarettes, uncomplicated: Secondary | ICD-10-CM

## 2017-10-04 DIAGNOSIS — G4700 Insomnia, unspecified: Secondary | ICD-10-CM

## 2017-10-04 DIAGNOSIS — R450 Nervousness: Secondary | ICD-10-CM | POA: Diagnosis not present

## 2017-10-04 DIAGNOSIS — F321 Major depressive disorder, single episode, moderate: Secondary | ICD-10-CM

## 2017-10-04 DIAGNOSIS — F1120 Opioid dependence, uncomplicated: Secondary | ICD-10-CM | POA: Diagnosis not present

## 2017-10-04 LAB — TSH: TSH: 0.58 mIU/L (ref 0.40–4.50)

## 2017-10-04 MED ORDER — TRAZODONE HCL 50 MG PO TABS: 50.0000 mg | ORAL_TABLET | Freq: Every evening | ORAL | 1 refills | Status: DC | PRN

## 2017-10-04 MED ORDER — VENLAFAXINE HCL ER 150 MG PO CP24: 150.0000 mg | ORAL_CAPSULE | Freq: Every day | ORAL | 1 refills | Status: DC

## 2017-10-04 NOTE — Patient Instructions (Signed)
1. Increase Effexor 150 mg daily  2. Continue Trazodone 50 mg at night as needed for sleep 3. Referral to CDIOP again(chemical dependency intensive outpatient program) 4. Return to clinic in two months for 15 mins 5. Obtain TSH

## 2017-10-05 ENCOUNTER — Encounter (HOSPITAL_COMMUNITY): Payer: Self-pay | Admitting: Psychiatry

## 2017-10-14 IMAGING — US US SCROTUM
1 series · 14 of 25 positions shown · non-contrast
Comparison: CT 11/27/2016.

CLINICAL DATA: Left testicular mass.

EXAM:
SCROTAL ULTRASOUND
DOPPLER ULTRASOUND OF THE TESTICLES
TECHNIQUE: Complete ultrasound examination of the testicles, epididymis, and
other scrotal structures was performed. Color and spectral Doppler
ultrasound were also utilized to evaluate blood flow to the
testicles.

[Series 1: us scrotum · 0.07mm/px · 14 of 65 slices shown]
[im 1/65]
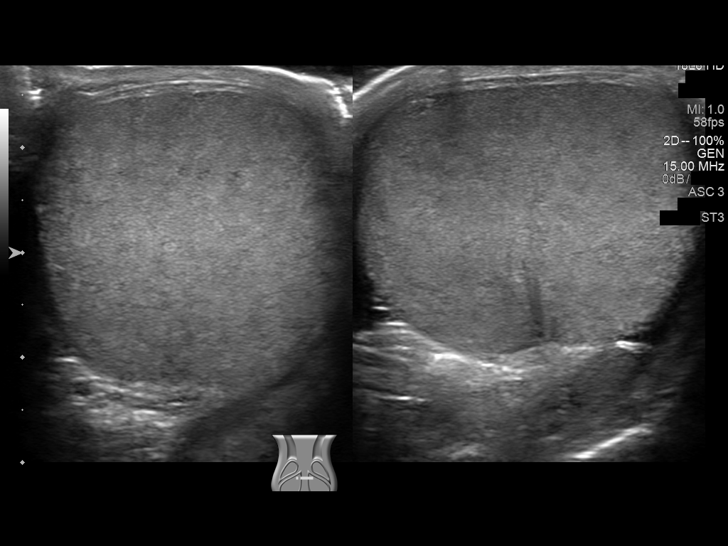
[im 6/65]
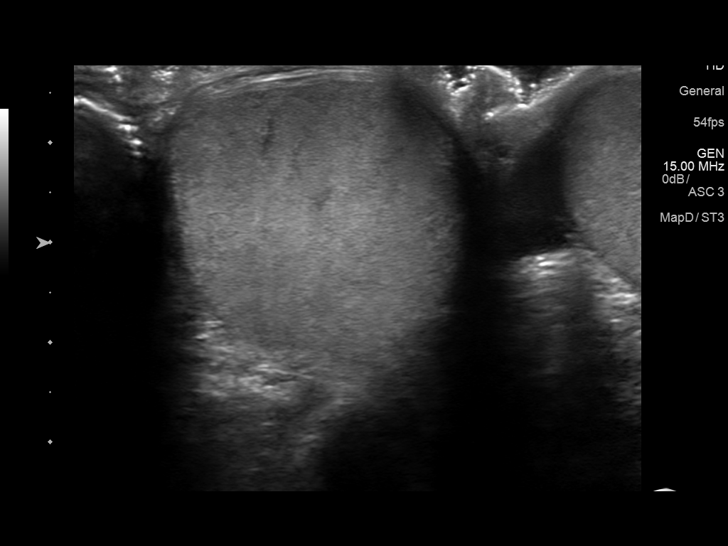
[im 11/65]
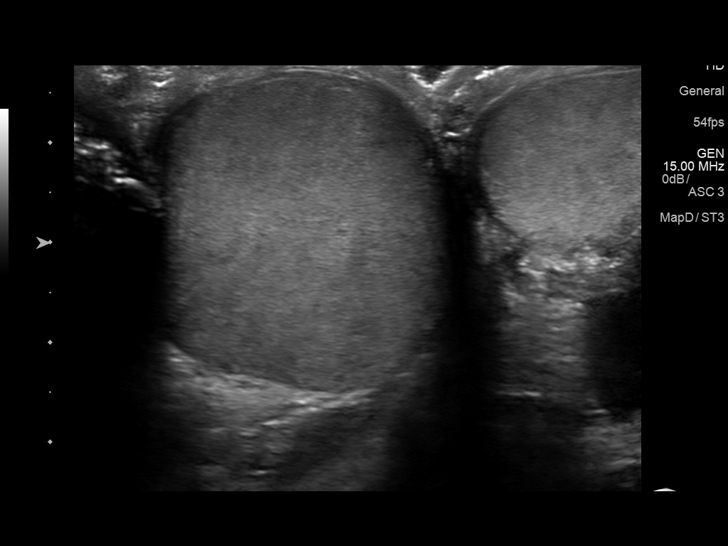
[im 17/65]
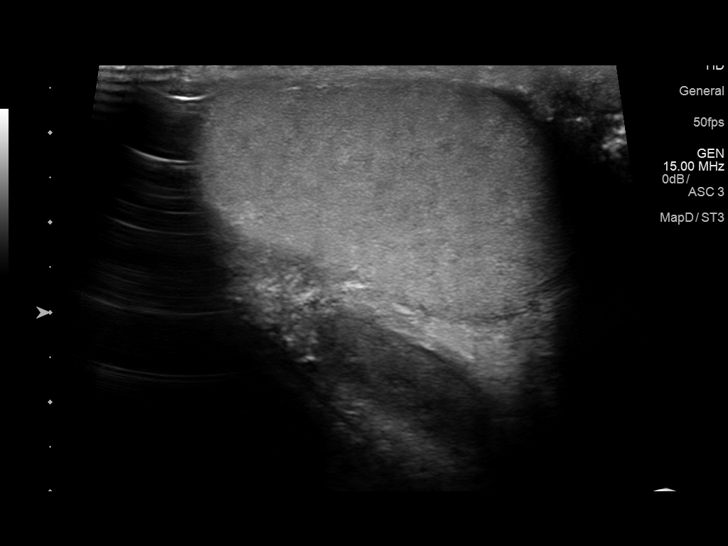
[im 22/65]
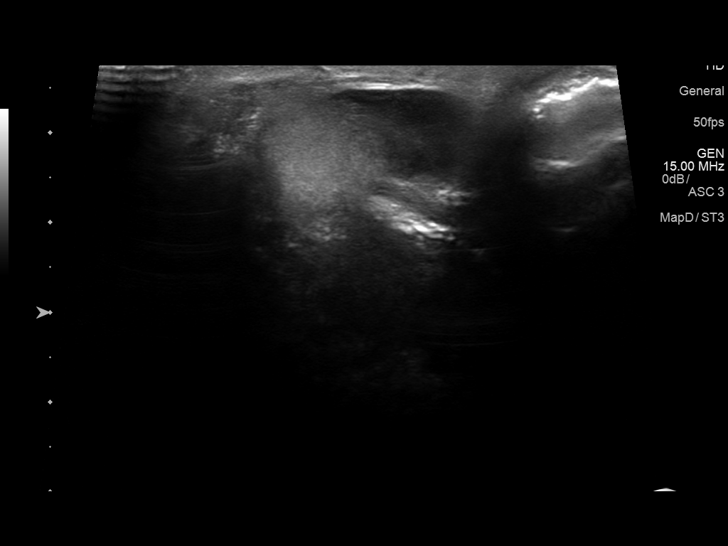
[im 25/65]
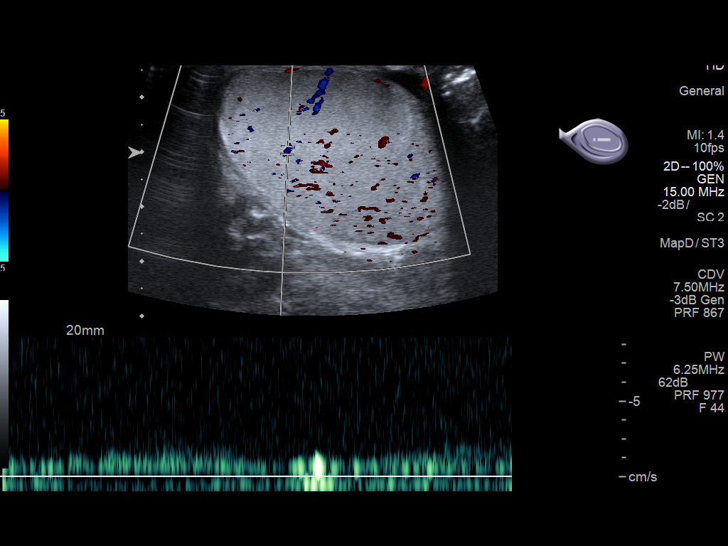
[im 30/65]
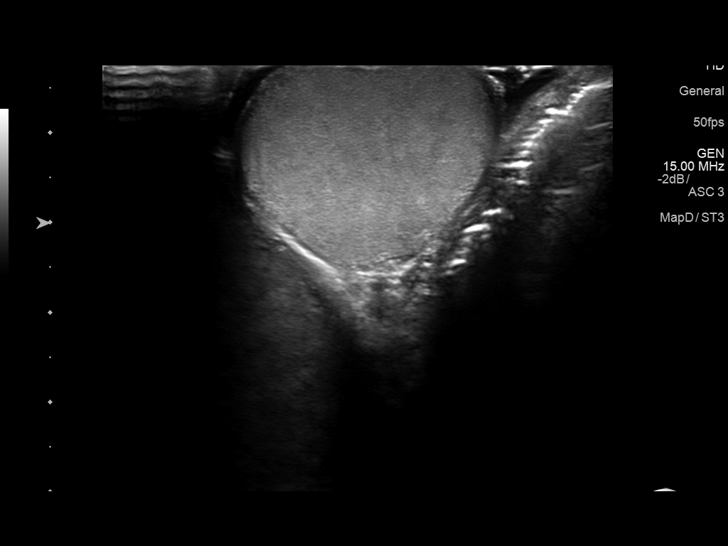
[im 35/65]
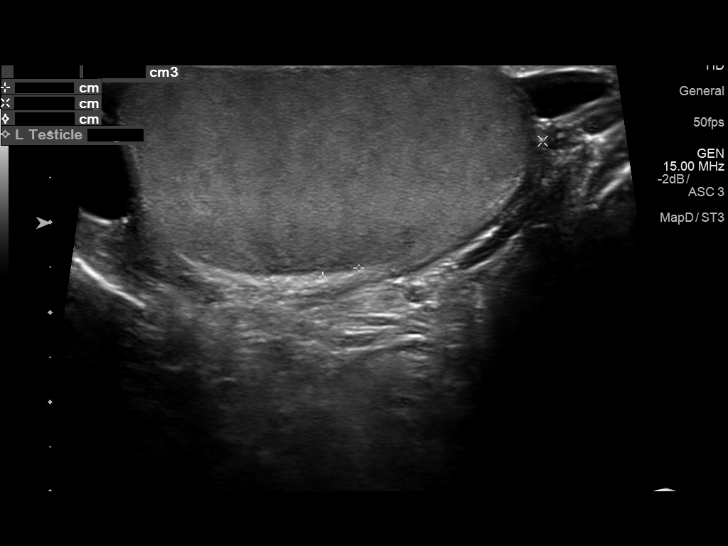
[im 41/65]
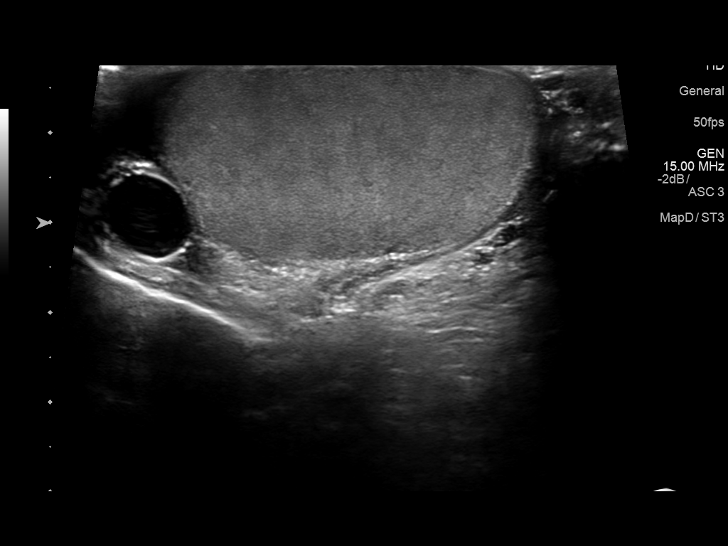
[im 43/65]
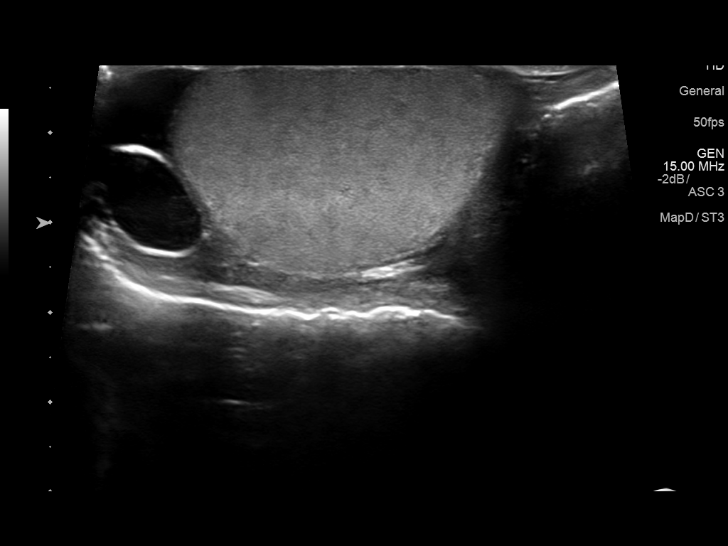
[im 49/65]
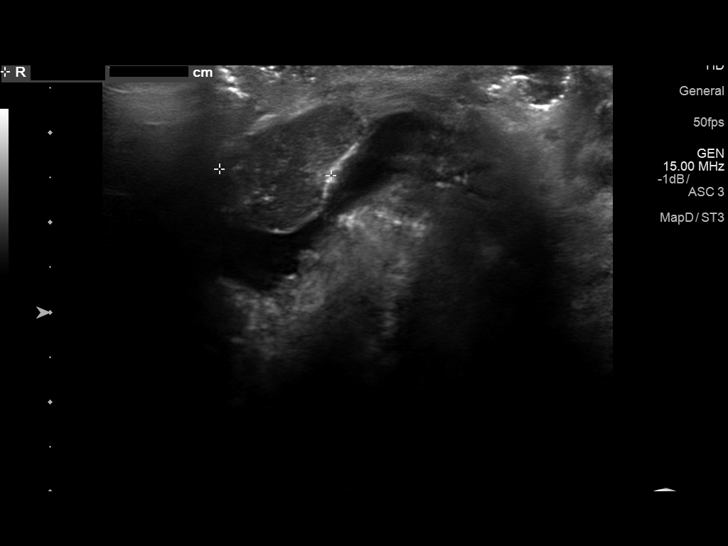
[im 54/65]
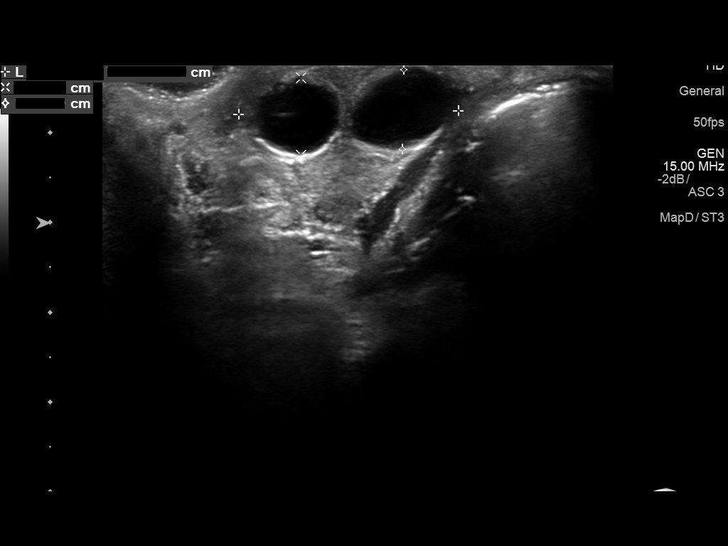
[im 59/65]
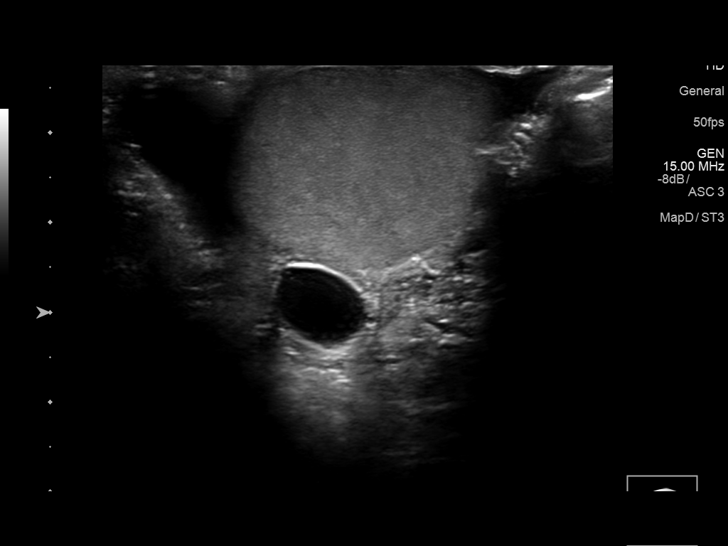
[im 65/65]
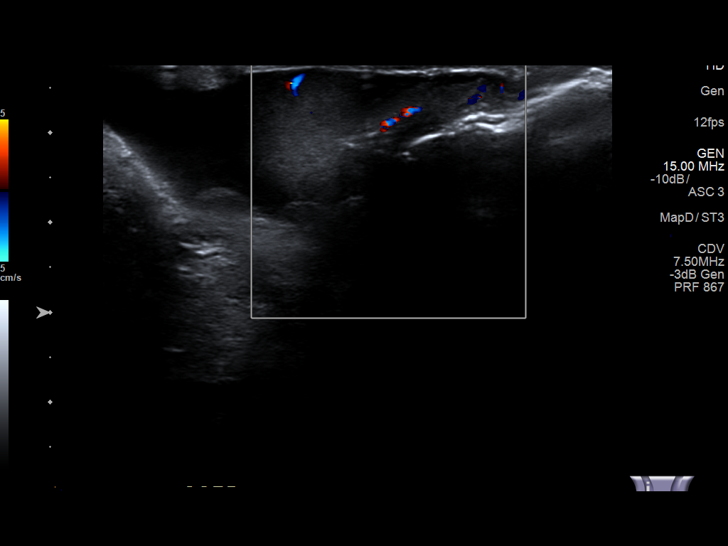

[14 of 25 positions shown; findings below may reference images not displayed]

FINDINGS: Right testicle

Measurements: 5.0 x 2.9 x 3.0 cm. No mass or microlithiasis
visualized.

Left testicle

Measurements: 4.8 x 2.5 x 3.0 cm. No mass or microlithiasis
visualized.

Right epididymis:  Normal in size and appearance.

Left epididymis:  1.0 and 0.9 cm epididymal cysts.

Hydrocele:  None visualized.

Varicocele:  None visualized.

Small hydrocele left-sided greater than right.

Pulsed Doppler interrogation of both testes demonstrates normal low
resistance arterial and venous waveforms bilaterally.
IMPRESSION: 1.  1.0 and 0.9 cm left epididymal cysts .

2. Small bilateral hydroceles, left side greater than right. Exam
otherwise unremarkable. No evidence of testicular mass or torsion.

## 2017-10-25 ENCOUNTER — Encounter (HOSPITAL_COMMUNITY): Payer: Self-pay | Admitting: Licensed Clinical Social Worker

## 2017-10-25 ENCOUNTER — Ambulatory Visit (INDEPENDENT_AMBULATORY_CARE_PROVIDER_SITE_OTHER): Payer: BLUE CROSS/BLUE SHIELD | Admitting: Licensed Clinical Social Worker

## 2017-10-25 DIAGNOSIS — F1420 Cocaine dependence, uncomplicated: Secondary | ICD-10-CM | POA: Diagnosis not present

## 2017-10-25 DIAGNOSIS — F1120 Opioid dependence, uncomplicated: Secondary | ICD-10-CM

## 2017-10-25 DIAGNOSIS — F321 Major depressive disorder, single episode, moderate: Secondary | ICD-10-CM | POA: Diagnosis not present

## 2017-10-25 NOTE — Progress Notes (Signed)
   THERAPIST PROGRESS NOTE  Session Time: 11:00 am-11:40 am  Participation Level: Active  Behavioral Response: CasualAlertEuthymic  Type of Therapy: Individual Therapy  Treatment Goals addressed: Coping  Interventions: CBT and Solution Focused  Summary: Brad Ramirez is a 37 y.o. male who presents oriented x5 (person, place, situation, time and object), alert, casually dressed, appropriately groomed, euthymic, and cooperative to address mood and substance use. Patient has a history of medical treatment including hyperglycemia, respiratory issues, nausea and vomiting. Patient has a history of mental health treatment including substance abuse in 2015. Patient denies symptoms of mania. Patient denies suicidal and homicidal ideations. He denies psychosis including auditory and visual hallucinations. Patient has a history of substance abuse including cocaine use and opioid use. Patient denies history of elopement. Patient is at low risk for lethality at this time.  Physically: Patient is feeling good and has improved energy. Spiritually/values: Patient has been looking for a job and has been hesitant to accept one because it was not his "dream" job. He has realized that he needs to find employment to help with the income and to reduce his idle time.  Relationships: Patient is getting along with his wife and children. He is open with his wife but there a few things he doesn't share because it would do more damage than good. He wants to get a sponsor so that he can share those type of things with them rather than his wife.  Emotional/Mental/Behavior: Patient's mood is good. He is focusing on trying to find work and staying sober. Patient is taking suboxen which has helped with his cravings and thoughts. Patient said that it was different than his previous experience with sobriety. Patient recognizes the needs to work and reduce his idle time.   Patient engaged in session. He responded well to  interventions. Patient continues to meet criteria for Current moderate episode of major depressive disorder without prior episode, Cocaine use disorder, moderate dependence and uncomplicated opioid dependence. Patient will continue in outpatient therapy due to being the least restrictive service to meet his needs at this time. Patient made minimal progress on his goals at this time.     Suicidal/Homicidal: Negativewithout intent/plan  Therapist Response: Therapist reviewed patient's recent thoughts and behaviors. Therapist utilized CBT to address mood and substance abuse. Therapist processed patient's feelings to identify triggers for mood. Therapist coached patient in steps he could take to manage mood and maintain sobriety.   Plan: Return again in 4 weeks.  Diagnosis: Axis I: Major Depression, single episode and Cocaine use disorder, moderate, dependence, and uncomplicated opioid dependence    Axis II: No diagnosis    Bynum BellowsJoshua Danel Studzinski, LCSW 10/25/2017

## 2017-12-01 NOTE — Progress Notes (Signed)
BH MD/PA/NP OP Progress Note  12/06/2017 10:09 AM Brad Ramirez  MRN:  [id number]  Chief Complaint:  Chief Complaint    Depression; Follow-up     HPI:  Patient presents for follow-up appointment for depression and polysubstance use disorder.  He states that he stopped taking Effexor a few weeks ago as he was feeling good.  He hopes not to take medication and felt he can discontinue it as suboxone works good for him. After discussion, he feels comfortable to reinitiate Effexor. 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 He reports great relationship with his children. He has not used any drugs for the past three months. He denies any craving for cocaine, opioid. He goes to NA meeting instead of contacting CDIOP; he feels it is more workable for his schedule. He has started to do regular exercise. He has insomnia when he does not take trazodone. He feels motivated and has good energy. He has fair concentration. He denies SI. He denies anxiety.    Visit Diagnosis:    ICD-10-CM   1. Current moderate episode of major depressive disorder without prior episode (HCC) F32.1   2. Cocaine use disorder, moderate, dependence (HCC) F14.20     Past Psychiatric History:  I have reviewed the patient's psychiatry history in detail and updated the patient record. Outpatient:denies Psychiatry admission:[facility] in 2015 for 30 day Previous suicide attempt:denies Past trials of medication:  History of violence:   Past Medical History:  Past Medical History:  Diagnosis Date  . Opioid abuse [facility])     Past Surgical History:  Procedure Laterality Date  . CHOLECYSTECTOMY N/A 05/28/2016   Procedure: LAPAROSCOPIC CHOLECYSTECTOMY;  Surgeon: Harriette Bouillon, MD;  Location: [facility] OR;  Service: General;  Laterality: N/A;  . ERCP N/A 05/27/2016   Procedure: ENDOSCOPIC RETROGRADE CHOLANGIOPANCREATOGRAPHY (ERCP);  Surgeon: Dorena Cookey, MD;  Location: [facility]  ENDOSCOPY;  Service: Endoscopy;  Laterality: N/A;  . TYMPANOSTOMY TUBE PLACEMENT Bilateral 1980s    Family Psychiatric History:  I have reviewed the patient's family history in detail and updated the patient record.  Family History:  Family History  Problem Relation Age of Onset  . Appendicitis Father   . Liver disease Neg Hx     Social History:  Social History   Socioeconomic History  . Marital status: Married    Spouse name: Not on file  . Number of children: Not on file  . Years of education: Not on file  . Highest education level: Not on file  Occupational History  . Not on file  Social Needs  . Financial resource strain: Not on file  . Food insecurity:    Worry: Not on file    Inability: Not on file  . Transportation needs:    Medical: Not on file    Non-medical: Not on file  Tobacco Use  . Smoking status: Current Every Day Smoker    Packs/day: 0.50    Years: 17.00    Pack years: 8.50    Types: Cigarettes  . Smokeless tobacco: Never Used  Substance and Sexual Activity  . Alcohol use: Yes    Comment: 05/26/2016 "might have 1/2 bottle of wine/month; if that"  . Drug use: No    Comment: hx of opiod abuse  . Sexual activity: Not on file  Lifestyle  . Physical activity:    Days per week: Not on file    Minutes per session: Not on file  .  Stress: Not on file  Relationships  . Social connections:    Talks on phone: Not on file    Gets together: Not on file    Attends religious service: Not on file    Active member of club or organization: Not on file    Attends meetings of clubs or organizations: Not on file    Relationship status: Not on file  Other Topics Concern  . Not on file  Social History Narrative  . Not on file    Allergies: No Known Allergies  Metabolic Disorder Labs: Lab Results  Component Value Date   HGBA1C 5.2 11/26/2016   MPG 103 11/26/2016   No results found for: PROLACTIN No results found for: CHOL, TRIG, HDL, CHOLHDL, VLDL,  LDLCALC Lab Results  Component Value Date   TSH 0.58 10/04/2017    Therapeutic Level Labs: No results found for: LITHIUM No results found for: VALPROATE No components found for:  CBMZ  Current Medications: Current Outpatient Medications  Medication Sig Dispense Refill  . acetaminophen (TYLENOL) 325 MG tablet You can take 2 tablets every 4 hours as needed. Tylenol(acetaminophen) is in your prescription medication. He cannot take more than 4000 mg per day.    . buprenorphine-naloxone (SUBOXONE) 8-2 mg SUBL SL tablet Place 1 tablet under the tongue 2 (two) times daily.    . traZODone (DESYREL) 50 MG tablet Take 1 tablet (50 mg total) by mouth at bedtime as needed for sleep. 90 tablet 0  . venlafaxine XR (EFFEXOR-XR) 150 MG 24 hr capsule Take 1 capsule (150 mg total) by mouth daily with breakfast. 90 capsule 0   No current facility-administered medications for this visit.      Musculoskeletal: Strength & Muscle Tone: within normal limits Gait & Station: normal Patient leans: N/A  Psychiatric Specialty Exam: Review of Systems  Psychiatric/Behavioral: Negative for depression, hallucinations, memory loss, substance abuse and suicidal ideas. The patient has insomnia. The patient is not nervous/anxious.   All other systems reviewed and are negative.   Blood pressure 117/83, pulse 87, height  (1.88 m), weight 233 lb (105.7 kg), SpO2 99 %.Body mass index is 29.92 kg/m.  General Appearance: Fairly Groomed  Eye Contact:  Good  Speech:  Clear and Coherent  Volume:  Normal  Mood:  "good"  Affect:  Appropriate, Congruent and euthymic, smiles  Thought Process:  Coherent  Orientation:  Full (Time, Place, and Person)  Thought Content: Logical   Suicidal Thoughts:  No  Homicidal Thoughts:  No  Memory:  Immediate;   Good  Judgement:  Good  Insight:  Fair  Psychomotor Activity:  Normal  Concentration:  Concentration: Good and Attention Span: Good  Recall:  Good  Fund of Knowledge:  Good  Language: Good  Akathisia:  No  Handed:  Right  AIMS (if indicated): not done  Assets:  Communication Skills Desire for Improvement  ADL's:  Intact  Cognition: WNL  Sleep:  Good on trazodone   Screenings:   Assessment and Plan:  Brad Ramirez is a 37 y.o. year old male with a history of depression, polysubstance use disorder (opioid, THC, benzo) , who presents for follow up appointment for Current moderate episode of major depressive disorder without prior episode (HCC)  Cocaine use disorder, moderate, dependence (HCC) He is unemployed, married and has two children.   # MDD, moderate, recurrent without psychotic features Patient self discontinued the Effexor a few weeks ago as he was feeling good. Discussed the importance of adherence to  medication to avoid relapse. He agrees to reinitiate Effexor to target depression. Will continue trazodone prn for insomnia. He will continue to see Mr. Sheets for therapy.   # Opioid use disorder # Cocaine use disorder He is at active phase for sobriety, He is on suboxone prescribed by other provider. He goes to [employer]. Will continue motivational interview.   Plan  1.Reinitiate Effexor 150 mg daily  2. Continue Trazodone 50 mg at night as needed for sleep 3. Return to clinic in three months for 15 mins 4. TSH reviewed; wnl  The patient demonstrates the following risk factors for suicide: Chronic risk factors for suicide include:psychiatric disorder ofdepressionand substance use disorder. Acute risk factorsfor suicide include: unemployment. Protective factorsfor this patient include: positive social support, responsibility to others (children, family), coping skills and hope for the future. Considering these factors, the overall suicide risk at this point appears to below. Patientisappropriate for outpatient follow up. Both the patient and his wife agrees to removes patient's access to gun at home.  Neysa Hotter,  MD 12/06/2017, 10:09 AM

## 2017-12-06 ENCOUNTER — Encounter (HOSPITAL_COMMUNITY): Payer: Self-pay | Admitting: Psychiatry

## 2017-12-06 ENCOUNTER — Ambulatory Visit (INDEPENDENT_AMBULATORY_CARE_PROVIDER_SITE_OTHER): Payer: BLUE CROSS/BLUE SHIELD | Admitting: Psychiatry

## 2017-12-06 VITALS — BP 117/83 | HR 87 | Ht 74.0 in | Wt 233.0 lb

## 2017-12-06 DIAGNOSIS — Z560 Unemployment, unspecified: Secondary | ICD-10-CM | POA: Diagnosis not present

## 2017-12-06 DIAGNOSIS — F321 Major depressive disorder, single episode, moderate: Secondary | ICD-10-CM | POA: Diagnosis not present

## 2017-12-06 DIAGNOSIS — G4700 Insomnia, unspecified: Secondary | ICD-10-CM | POA: Diagnosis not present

## 2017-12-06 DIAGNOSIS — F17210 Nicotine dependence, cigarettes, uncomplicated: Secondary | ICD-10-CM | POA: Diagnosis not present

## 2017-12-06 DIAGNOSIS — F1420 Cocaine dependence, uncomplicated: Secondary | ICD-10-CM

## 2017-12-06 DIAGNOSIS — F1120 Opioid dependence, uncomplicated: Secondary | ICD-10-CM | POA: Diagnosis not present

## 2017-12-06 MED ORDER — VENLAFAXINE HCL ER 150 MG PO CP24: 150.0000 mg | ORAL_CAPSULE | Freq: Every day | ORAL | 0 refills | Status: AC

## 2017-12-06 MED ORDER — TRAZODONE HCL 50 MG PO TABS: 50.0000 mg | ORAL_TABLET | Freq: Every evening | ORAL | 0 refills | Status: AC | PRN

## 2017-12-06 NOTE — Patient Instructions (Signed)
1.Reinitiate Effexor 150 mg daily  2. Continue Trazodone 50 mg at night as needed for sleep 3. Return to clinic in three months for 15 mins

## 2018-09-02 ENCOUNTER — Other Ambulatory Visit: Payer: Self-pay | Admitting: Physical Medicine and Rehabilitation

## 2018-09-02 ENCOUNTER — Ambulatory Visit
Admission: RE | Admit: 2018-09-02 | Discharge: 2018-09-02 | Disposition: A | Discharge status: Home / Self Care | Payer: No Typology Code available for payment source | Source: Ambulatory Visit | Attending: Physical Medicine and Rehabilitation | Admitting: Physical Medicine and Rehabilitation

## 2018-09-02 DIAGNOSIS — Z87898 Personal history of other specified conditions: Secondary | ICD-10-CM
